# Patient Record
Sex: Male | Born: 1945 | Race: White | Hispanic: No | Marital: Married | Smoking: Never smoker
Health system: Southern US, Community
[De-identification: ages and names within clinical notes are randomized; demographics above are authoritative.]

## PROBLEM LIST (undated history)

## (undated) DIAGNOSIS — T783XXA Angioneurotic edema, initial encounter: Secondary | ICD-10-CM

## (undated) DIAGNOSIS — C61 Malignant neoplasm of prostate: Secondary | ICD-10-CM

## (undated) DIAGNOSIS — G4733 Obstructive sleep apnea (adult) (pediatric): Secondary | ICD-10-CM

## (undated) DIAGNOSIS — Z91038 Other insect allergy status: Secondary | ICD-10-CM

## (undated) HISTORY — PX: TONSILLECTOMY: SUR1361

## (undated) HISTORY — DX: Other insect allergy status: Z91.038

## (undated) HISTORY — DX: Obstructive sleep apnea (adult) (pediatric): G47.33

## (undated) HISTORY — DX: Malignant neoplasm of prostate: C61

## (undated) HISTORY — PX: ADENOIDECTOMY: SUR15

## (undated) HISTORY — DX: Angioneurotic edema, initial encounter: T78.3XXA

---

## 2013-05-20 DIAGNOSIS — M23305 Other meniscus derangements, unspecified medial meniscus, unspecified knee: Secondary | ICD-10-CM | POA: Diagnosis not present

## 2013-05-20 DIAGNOSIS — IMO0002 Reserved for concepts with insufficient information to code with codable children: Secondary | ICD-10-CM | POA: Diagnosis not present

## 2013-05-20 DIAGNOSIS — M1710 Unilateral primary osteoarthritis, unspecified knee: Secondary | ICD-10-CM | POA: Diagnosis not present

## 2013-06-02 DIAGNOSIS — Y999 Unspecified external cause status: Secondary | ICD-10-CM | POA: Diagnosis not present

## 2013-06-02 DIAGNOSIS — G8918 Other acute postprocedural pain: Secondary | ICD-10-CM | POA: Diagnosis not present

## 2013-06-02 DIAGNOSIS — X58XXXA Exposure to other specified factors, initial encounter: Secondary | ICD-10-CM | POA: Diagnosis not present

## 2013-06-02 DIAGNOSIS — M6750 Plica syndrome, unspecified knee: Secondary | ICD-10-CM | POA: Diagnosis not present

## 2013-06-02 DIAGNOSIS — Y939 Activity, unspecified: Secondary | ICD-10-CM | POA: Diagnosis not present

## 2013-06-02 DIAGNOSIS — Y929 Unspecified place or not applicable: Secondary | ICD-10-CM | POA: Diagnosis not present

## 2013-06-02 DIAGNOSIS — IMO0002 Reserved for concepts with insufficient information to code with codable children: Secondary | ICD-10-CM | POA: Diagnosis not present

## 2013-06-02 DIAGNOSIS — S83289A Other tear of lateral meniscus, current injury, unspecified knee, initial encounter: Secondary | ICD-10-CM | POA: Diagnosis not present

## 2013-06-10 DIAGNOSIS — T782XXA Anaphylactic shock, unspecified, initial encounter: Secondary | ICD-10-CM | POA: Diagnosis not present

## 2013-06-18 DIAGNOSIS — M25569 Pain in unspecified knee: Secondary | ICD-10-CM | POA: Diagnosis not present

## 2013-06-21 DIAGNOSIS — M25569 Pain in unspecified knee: Secondary | ICD-10-CM | POA: Diagnosis not present

## 2013-06-25 DIAGNOSIS — M25569 Pain in unspecified knee: Secondary | ICD-10-CM | POA: Diagnosis not present

## 2013-06-28 DIAGNOSIS — M25569 Pain in unspecified knee: Secondary | ICD-10-CM | POA: Diagnosis not present

## 2013-07-02 DIAGNOSIS — M25569 Pain in unspecified knee: Secondary | ICD-10-CM | POA: Diagnosis not present

## 2013-07-05 DIAGNOSIS — M25569 Pain in unspecified knee: Secondary | ICD-10-CM | POA: Diagnosis not present

## 2013-07-07 DIAGNOSIS — M25569 Pain in unspecified knee: Secondary | ICD-10-CM | POA: Diagnosis not present

## 2013-08-05 DIAGNOSIS — T6391XA Toxic effect of contact with unspecified venomous animal, accidental (unintentional), initial encounter: Secondary | ICD-10-CM | POA: Diagnosis not present

## 2013-08-27 DIAGNOSIS — D128 Benign neoplasm of rectum: Secondary | ICD-10-CM | POA: Diagnosis not present

## 2013-08-27 DIAGNOSIS — D126 Benign neoplasm of colon, unspecified: Secondary | ICD-10-CM | POA: Diagnosis not present

## 2013-08-27 DIAGNOSIS — D129 Benign neoplasm of anus and anal canal: Secondary | ICD-10-CM | POA: Diagnosis not present

## 2013-08-27 DIAGNOSIS — D175 Benign lipomatous neoplasm of intra-abdominal organs: Secondary | ICD-10-CM | POA: Diagnosis not present

## 2013-08-27 DIAGNOSIS — K5730 Diverticulosis of large intestine without perforation or abscess without bleeding: Secondary | ICD-10-CM | POA: Diagnosis not present

## 2013-08-27 DIAGNOSIS — Z86010 Personal history of colonic polyps: Secondary | ICD-10-CM | POA: Diagnosis not present

## 2013-09-21 DIAGNOSIS — K219 Gastro-esophageal reflux disease without esophagitis: Secondary | ICD-10-CM | POA: Diagnosis not present

## 2013-09-21 DIAGNOSIS — J309 Allergic rhinitis, unspecified: Secondary | ICD-10-CM | POA: Diagnosis not present

## 2013-09-30 DIAGNOSIS — T6391XA Toxic effect of contact with unspecified venomous animal, accidental (unintentional), initial encounter: Secondary | ICD-10-CM | POA: Diagnosis not present

## 2013-11-18 DIAGNOSIS — T6391XA Toxic effect of contact with unspecified venomous animal, accidental (unintentional), initial encounter: Secondary | ICD-10-CM | POA: Diagnosis not present

## 2013-12-28 DIAGNOSIS — I8390 Asymptomatic varicose veins of unspecified lower extremity: Secondary | ICD-10-CM | POA: Diagnosis not present

## 2014-01-07 DIAGNOSIS — I83893 Varicose veins of bilateral lower extremities with other complications: Secondary | ICD-10-CM | POA: Diagnosis not present

## 2014-01-12 DIAGNOSIS — I83893 Varicose veins of bilateral lower extremities with other complications: Secondary | ICD-10-CM | POA: Diagnosis not present

## 2014-01-13 DIAGNOSIS — T6391XA Toxic effect of contact with unspecified venomous animal, accidental (unintentional), initial encounter: Secondary | ICD-10-CM | POA: Diagnosis not present

## 2014-02-08 DIAGNOSIS — N390 Urinary tract infection, site not specified: Secondary | ICD-10-CM | POA: Diagnosis not present

## 2014-02-08 DIAGNOSIS — R972 Elevated prostate specific antigen [PSA]: Secondary | ICD-10-CM | POA: Diagnosis not present

## 2014-02-08 DIAGNOSIS — R5381 Other malaise: Secondary | ICD-10-CM | POA: Diagnosis not present

## 2014-02-08 DIAGNOSIS — R5383 Other fatigue: Secondary | ICD-10-CM | POA: Diagnosis not present

## 2014-02-08 DIAGNOSIS — N400 Enlarged prostate without lower urinary tract symptoms: Secondary | ICD-10-CM | POA: Diagnosis not present

## 2014-02-10 DIAGNOSIS — I10 Essential (primary) hypertension: Secondary | ICD-10-CM | POA: Diagnosis not present

## 2014-02-10 DIAGNOSIS — E559 Vitamin D deficiency, unspecified: Secondary | ICD-10-CM | POA: Diagnosis not present

## 2014-02-10 DIAGNOSIS — Z1329 Encounter for screening for other suspected endocrine disorder: Secondary | ICD-10-CM | POA: Diagnosis not present

## 2014-02-10 DIAGNOSIS — E782 Mixed hyperlipidemia: Secondary | ICD-10-CM | POA: Diagnosis not present

## 2014-02-10 DIAGNOSIS — Z130 Encounter for screening for diseases of the blood and blood-forming organs and certain disorders involving the immune mechanism: Secondary | ICD-10-CM | POA: Diagnosis not present

## 2014-02-10 DIAGNOSIS — K219 Gastro-esophageal reflux disease without esophagitis: Secondary | ICD-10-CM | POA: Diagnosis not present

## 2014-02-10 DIAGNOSIS — G4733 Obstructive sleep apnea (adult) (pediatric): Secondary | ICD-10-CM | POA: Diagnosis not present

## 2014-03-10 DIAGNOSIS — T63441D Toxic effect of venom of bees, accidental (unintentional), subsequent encounter: Secondary | ICD-10-CM | POA: Diagnosis not present

## 2014-03-10 DIAGNOSIS — T63441A Toxic effect of venom of bees, accidental (unintentional), initial encounter: Secondary | ICD-10-CM | POA: Diagnosis not present

## 2014-03-14 DIAGNOSIS — T63441D Toxic effect of venom of bees, accidental (unintentional), subsequent encounter: Secondary | ICD-10-CM | POA: Diagnosis not present

## 2014-03-14 DIAGNOSIS — T63441A Toxic effect of venom of bees, accidental (unintentional), initial encounter: Secondary | ICD-10-CM | POA: Diagnosis not present

## 2014-04-04 DIAGNOSIS — H25813 Combined forms of age-related cataract, bilateral: Secondary | ICD-10-CM | POA: Diagnosis not present

## 2014-04-04 DIAGNOSIS — H18413 Arcus senilis, bilateral: Secondary | ICD-10-CM | POA: Diagnosis not present

## 2014-04-04 DIAGNOSIS — H35372 Puckering of macula, left eye: Secondary | ICD-10-CM | POA: Diagnosis not present

## 2014-04-06 DIAGNOSIS — R5383 Other fatigue: Secondary | ICD-10-CM | POA: Diagnosis not present

## 2014-04-13 DIAGNOSIS — I872 Venous insufficiency (chronic) (peripheral): Secondary | ICD-10-CM | POA: Diagnosis not present

## 2014-04-13 DIAGNOSIS — I83812 Varicose veins of left lower extremities with pain: Secondary | ICD-10-CM | POA: Diagnosis not present

## 2014-04-27 DIAGNOSIS — E298 Other testicular dysfunction: Secondary | ICD-10-CM | POA: Diagnosis not present

## 2014-05-05 DIAGNOSIS — T63441A Toxic effect of venom of bees, accidental (unintentional), initial encounter: Secondary | ICD-10-CM | POA: Diagnosis not present

## 2014-05-17 DIAGNOSIS — T63441A Toxic effect of venom of bees, accidental (unintentional), initial encounter: Secondary | ICD-10-CM | POA: Diagnosis not present

## 2014-05-18 DIAGNOSIS — E298 Other testicular dysfunction: Secondary | ICD-10-CM | POA: Diagnosis not present

## 2014-05-18 DIAGNOSIS — T63441A Toxic effect of venom of bees, accidental (unintentional), initial encounter: Secondary | ICD-10-CM | POA: Diagnosis not present

## 2014-05-19 DIAGNOSIS — T63441A Toxic effect of venom of bees, accidental (unintentional), initial encounter: Secondary | ICD-10-CM | POA: Diagnosis not present

## 2014-06-08 DIAGNOSIS — E291 Testicular hypofunction: Secondary | ICD-10-CM | POA: Diagnosis not present

## 2014-06-14 DIAGNOSIS — E298 Other testicular dysfunction: Secondary | ICD-10-CM | POA: Diagnosis not present

## 2014-06-28 DIAGNOSIS — G4733 Obstructive sleep apnea (adult) (pediatric): Secondary | ICD-10-CM | POA: Diagnosis not present

## 2014-06-29 DIAGNOSIS — E291 Testicular hypofunction: Secondary | ICD-10-CM | POA: Diagnosis not present

## 2014-06-30 DIAGNOSIS — T63441A Toxic effect of venom of bees, accidental (unintentional), initial encounter: Secondary | ICD-10-CM | POA: Diagnosis not present

## 2014-07-07 DIAGNOSIS — I872 Venous insufficiency (chronic) (peripheral): Secondary | ICD-10-CM | POA: Diagnosis not present

## 2014-07-07 DIAGNOSIS — I83812 Varicose veins of left lower extremities with pain: Secondary | ICD-10-CM | POA: Diagnosis not present

## 2014-07-20 DIAGNOSIS — E298 Other testicular dysfunction: Secondary | ICD-10-CM | POA: Diagnosis not present

## 2014-07-21 DIAGNOSIS — I83812 Varicose veins of left lower extremities with pain: Secondary | ICD-10-CM | POA: Diagnosis not present

## 2014-07-21 DIAGNOSIS — I872 Venous insufficiency (chronic) (peripheral): Secondary | ICD-10-CM | POA: Diagnosis not present

## 2014-08-10 DIAGNOSIS — E298 Other testicular dysfunction: Secondary | ICD-10-CM | POA: Diagnosis not present

## 2014-08-15 DIAGNOSIS — M25561 Pain in right knee: Secondary | ICD-10-CM | POA: Diagnosis not present

## 2014-08-25 DIAGNOSIS — T63441A Toxic effect of venom of bees, accidental (unintentional), initial encounter: Secondary | ICD-10-CM | POA: Diagnosis not present

## 2014-09-21 DIAGNOSIS — E298 Other testicular dysfunction: Secondary | ICD-10-CM | POA: Diagnosis not present

## 2014-10-20 DIAGNOSIS — T63441A Toxic effect of venom of bees, accidental (unintentional), initial encounter: Secondary | ICD-10-CM | POA: Diagnosis not present

## 2014-12-09 DIAGNOSIS — E298 Other testicular dysfunction: Secondary | ICD-10-CM | POA: Diagnosis not present

## 2014-12-15 DIAGNOSIS — T63441A Toxic effect of venom of bees, accidental (unintentional), initial encounter: Secondary | ICD-10-CM | POA: Diagnosis not present

## 2015-01-11 DIAGNOSIS — T782XXA Anaphylactic shock, unspecified, initial encounter: Secondary | ICD-10-CM

## 2015-01-11 DIAGNOSIS — T63481A Toxic effect of venom of other arthropod, accidental (unintentional), initial encounter: Secondary | ICD-10-CM | POA: Insufficient documentation

## 2015-01-11 DIAGNOSIS — T7800XA Anaphylactic reaction due to unspecified food, initial encounter: Secondary | ICD-10-CM | POA: Insufficient documentation

## 2015-01-30 DIAGNOSIS — T63441A Toxic effect of venom of bees, accidental (unintentional), initial encounter: Secondary | ICD-10-CM | POA: Diagnosis not present

## 2015-01-31 DIAGNOSIS — T63441A Toxic effect of venom of bees, accidental (unintentional), initial encounter: Secondary | ICD-10-CM | POA: Diagnosis not present

## 2015-02-01 DIAGNOSIS — T63441A Toxic effect of venom of bees, accidental (unintentional), initial encounter: Secondary | ICD-10-CM | POA: Diagnosis not present

## 2015-02-06 DIAGNOSIS — T63441A Toxic effect of venom of bees, accidental (unintentional), initial encounter: Secondary | ICD-10-CM | POA: Diagnosis not present

## 2015-03-02 DIAGNOSIS — E291 Testicular hypofunction: Secondary | ICD-10-CM | POA: Diagnosis not present

## 2015-03-02 DIAGNOSIS — N401 Enlarged prostate with lower urinary tract symptoms: Secondary | ICD-10-CM | POA: Diagnosis not present

## 2015-03-02 DIAGNOSIS — R351 Nocturia: Secondary | ICD-10-CM | POA: Diagnosis not present

## 2015-03-02 DIAGNOSIS — N5203 Combined arterial insufficiency and corporo-venous occlusive erectile dysfunction: Secondary | ICD-10-CM | POA: Diagnosis not present

## 2015-03-02 DIAGNOSIS — N3020 Other chronic cystitis without hematuria: Secondary | ICD-10-CM | POA: Diagnosis not present

## 2015-03-02 DIAGNOSIS — E298 Other testicular dysfunction: Secondary | ICD-10-CM | POA: Diagnosis not present

## 2015-03-03 DIAGNOSIS — Z23 Encounter for immunization: Secondary | ICD-10-CM | POA: Diagnosis not present

## 2015-03-03 DIAGNOSIS — G4733 Obstructive sleep apnea (adult) (pediatric): Secondary | ICD-10-CM | POA: Diagnosis not present

## 2015-03-03 DIAGNOSIS — Z0000 Encounter for general adult medical examination without abnormal findings: Secondary | ICD-10-CM | POA: Diagnosis not present

## 2015-03-03 DIAGNOSIS — E559 Vitamin D deficiency, unspecified: Secondary | ICD-10-CM | POA: Diagnosis not present

## 2015-03-03 DIAGNOSIS — Z79899 Other long term (current) drug therapy: Secondary | ICD-10-CM | POA: Diagnosis not present

## 2015-03-03 DIAGNOSIS — I10 Essential (primary) hypertension: Secondary | ICD-10-CM | POA: Diagnosis not present

## 2015-03-03 DIAGNOSIS — E782 Mixed hyperlipidemia: Secondary | ICD-10-CM | POA: Diagnosis not present

## 2015-03-03 DIAGNOSIS — K219 Gastro-esophageal reflux disease without esophagitis: Secondary | ICD-10-CM | POA: Diagnosis not present

## 2015-03-27 ENCOUNTER — Ambulatory Visit (INDEPENDENT_AMBULATORY_CARE_PROVIDER_SITE_OTHER): Payer: Medicare Other

## 2015-03-27 DIAGNOSIS — T63441D Toxic effect of venom of bees, accidental (unintentional), subsequent encounter: Secondary | ICD-10-CM

## 2015-04-10 DIAGNOSIS — H25813 Combined forms of age-related cataract, bilateral: Secondary | ICD-10-CM | POA: Diagnosis not present

## 2015-05-22 ENCOUNTER — Ambulatory Visit (INDEPENDENT_AMBULATORY_CARE_PROVIDER_SITE_OTHER): Payer: Medicare Other

## 2015-05-22 DIAGNOSIS — T63441D Toxic effect of venom of bees, accidental (unintentional), subsequent encounter: Secondary | ICD-10-CM | POA: Diagnosis not present

## 2015-05-23 DIAGNOSIS — E782 Mixed hyperlipidemia: Secondary | ICD-10-CM | POA: Diagnosis not present

## 2015-07-17 ENCOUNTER — Ambulatory Visit (INDEPENDENT_AMBULATORY_CARE_PROVIDER_SITE_OTHER): Payer: Medicare Other

## 2015-07-17 DIAGNOSIS — T63441D Toxic effect of venom of bees, accidental (unintentional), subsequent encounter: Secondary | ICD-10-CM

## 2015-08-23 DIAGNOSIS — G4733 Obstructive sleep apnea (adult) (pediatric): Secondary | ICD-10-CM | POA: Diagnosis not present

## 2015-09-11 ENCOUNTER — Ambulatory Visit (INDEPENDENT_AMBULATORY_CARE_PROVIDER_SITE_OTHER): Payer: Medicare Other

## 2015-09-11 DIAGNOSIS — T63441D Toxic effect of venom of bees, accidental (unintentional), subsequent encounter: Secondary | ICD-10-CM | POA: Diagnosis not present

## 2015-11-16 ENCOUNTER — Ambulatory Visit (INDEPENDENT_AMBULATORY_CARE_PROVIDER_SITE_OTHER): Payer: Medicare Other | Admitting: *Deleted

## 2015-11-16 DIAGNOSIS — T63441D Toxic effect of venom of bees, accidental (unintentional), subsequent encounter: Secondary | ICD-10-CM | POA: Diagnosis not present

## 2016-01-11 ENCOUNTER — Ambulatory Visit (INDEPENDENT_AMBULATORY_CARE_PROVIDER_SITE_OTHER): Payer: Medicare Other | Admitting: *Deleted

## 2016-01-11 DIAGNOSIS — T63441D Toxic effect of venom of bees, accidental (unintentional), subsequent encounter: Secondary | ICD-10-CM | POA: Diagnosis not present

## 2016-01-24 DIAGNOSIS — E782 Mixed hyperlipidemia: Secondary | ICD-10-CM | POA: Diagnosis not present

## 2016-01-24 DIAGNOSIS — Z79899 Other long term (current) drug therapy: Secondary | ICD-10-CM | POA: Diagnosis not present

## 2016-01-24 DIAGNOSIS — E559 Vitamin D deficiency, unspecified: Secondary | ICD-10-CM | POA: Diagnosis not present

## 2016-01-29 DIAGNOSIS — L820 Inflamed seborrheic keratosis: Secondary | ICD-10-CM | POA: Diagnosis not present

## 2016-01-29 DIAGNOSIS — D1801 Hemangioma of skin and subcutaneous tissue: Secondary | ICD-10-CM | POA: Diagnosis not present

## 2016-03-07 ENCOUNTER — Ambulatory Visit (INDEPENDENT_AMBULATORY_CARE_PROVIDER_SITE_OTHER): Payer: Medicare Other | Admitting: *Deleted

## 2016-03-07 DIAGNOSIS — T63441D Toxic effect of venom of bees, accidental (unintentional), subsequent encounter: Secondary | ICD-10-CM | POA: Diagnosis not present

## 2016-04-16 DIAGNOSIS — N3020 Other chronic cystitis without hematuria: Secondary | ICD-10-CM | POA: Diagnosis not present

## 2016-04-16 DIAGNOSIS — N318 Other neuromuscular dysfunction of bladder: Secondary | ICD-10-CM | POA: Diagnosis not present

## 2016-04-16 DIAGNOSIS — R972 Elevated prostate specific antigen [PSA]: Secondary | ICD-10-CM | POA: Diagnosis not present

## 2016-04-16 DIAGNOSIS — N400 Enlarged prostate without lower urinary tract symptoms: Secondary | ICD-10-CM | POA: Diagnosis not present

## 2016-04-24 DIAGNOSIS — Z0000 Encounter for general adult medical examination without abnormal findings: Secondary | ICD-10-CM | POA: Diagnosis not present

## 2016-04-24 DIAGNOSIS — Z23 Encounter for immunization: Secondary | ICD-10-CM | POA: Diagnosis not present

## 2016-04-24 DIAGNOSIS — R7309 Other abnormal glucose: Secondary | ICD-10-CM | POA: Diagnosis not present

## 2016-04-24 DIAGNOSIS — N401 Enlarged prostate with lower urinary tract symptoms: Secondary | ICD-10-CM | POA: Diagnosis not present

## 2016-04-24 DIAGNOSIS — G4733 Obstructive sleep apnea (adult) (pediatric): Secondary | ICD-10-CM | POA: Diagnosis not present

## 2016-04-24 DIAGNOSIS — E559 Vitamin D deficiency, unspecified: Secondary | ICD-10-CM | POA: Diagnosis not present

## 2016-04-24 DIAGNOSIS — Z9989 Dependence on other enabling machines and devices: Secondary | ICD-10-CM | POA: Diagnosis not present

## 2016-04-24 DIAGNOSIS — I10 Essential (primary) hypertension: Secondary | ICD-10-CM | POA: Diagnosis not present

## 2016-04-24 DIAGNOSIS — E782 Mixed hyperlipidemia: Secondary | ICD-10-CM | POA: Diagnosis not present

## 2016-05-02 ENCOUNTER — Ambulatory Visit (INDEPENDENT_AMBULATORY_CARE_PROVIDER_SITE_OTHER): Payer: Medicare Other | Admitting: *Deleted

## 2016-05-02 DIAGNOSIS — T63441D Toxic effect of venom of bees, accidental (unintentional), subsequent encounter: Secondary | ICD-10-CM

## 2016-05-07 DIAGNOSIS — H25813 Combined forms of age-related cataract, bilateral: Secondary | ICD-10-CM | POA: Diagnosis not present

## 2016-05-07 DIAGNOSIS — H18413 Arcus senilis, bilateral: Secondary | ICD-10-CM | POA: Diagnosis not present

## 2016-06-06 DIAGNOSIS — N3020 Other chronic cystitis without hematuria: Secondary | ICD-10-CM | POA: Diagnosis not present

## 2016-06-06 DIAGNOSIS — R972 Elevated prostate specific antigen [PSA]: Secondary | ICD-10-CM | POA: Diagnosis not present

## 2016-06-27 ENCOUNTER — Ambulatory Visit (INDEPENDENT_AMBULATORY_CARE_PROVIDER_SITE_OTHER): Payer: Medicare Other | Admitting: *Deleted

## 2016-06-27 DIAGNOSIS — T63441D Toxic effect of venom of bees, accidental (unintentional), subsequent encounter: Secondary | ICD-10-CM | POA: Diagnosis not present

## 2016-08-22 ENCOUNTER — Ambulatory Visit (INDEPENDENT_AMBULATORY_CARE_PROVIDER_SITE_OTHER): Payer: Medicare Other | Admitting: *Deleted

## 2016-08-22 DIAGNOSIS — T63441D Toxic effect of venom of bees, accidental (unintentional), subsequent encounter: Secondary | ICD-10-CM

## 2016-10-24 ENCOUNTER — Ambulatory Visit (INDEPENDENT_AMBULATORY_CARE_PROVIDER_SITE_OTHER): Payer: Medicare Other

## 2016-10-24 DIAGNOSIS — T63441D Toxic effect of venom of bees, accidental (unintentional), subsequent encounter: Secondary | ICD-10-CM | POA: Diagnosis not present

## 2016-10-29 DIAGNOSIS — G4733 Obstructive sleep apnea (adult) (pediatric): Secondary | ICD-10-CM | POA: Diagnosis not present

## 2016-12-19 ENCOUNTER — Ambulatory Visit (INDEPENDENT_AMBULATORY_CARE_PROVIDER_SITE_OTHER): Payer: Medicare Other | Admitting: *Deleted

## 2016-12-19 DIAGNOSIS — T63441D Toxic effect of venom of bees, accidental (unintentional), subsequent encounter: Secondary | ICD-10-CM | POA: Diagnosis not present

## 2016-12-22 DIAGNOSIS — G4733 Obstructive sleep apnea (adult) (pediatric): Secondary | ICD-10-CM | POA: Diagnosis not present

## 2017-01-02 DIAGNOSIS — L821 Other seborrheic keratosis: Secondary | ICD-10-CM | POA: Diagnosis not present

## 2017-01-02 DIAGNOSIS — D485 Neoplasm of uncertain behavior of skin: Secondary | ICD-10-CM | POA: Diagnosis not present

## 2017-01-02 DIAGNOSIS — L570 Actinic keratosis: Secondary | ICD-10-CM | POA: Diagnosis not present

## 2017-01-08 DIAGNOSIS — G4733 Obstructive sleep apnea (adult) (pediatric): Secondary | ICD-10-CM | POA: Diagnosis not present

## 2017-02-13 ENCOUNTER — Ambulatory Visit (INDEPENDENT_AMBULATORY_CARE_PROVIDER_SITE_OTHER): Payer: Medicare Other | Admitting: *Deleted

## 2017-02-13 DIAGNOSIS — T63441D Toxic effect of venom of bees, accidental (unintentional), subsequent encounter: Secondary | ICD-10-CM | POA: Diagnosis not present

## 2017-04-10 ENCOUNTER — Ambulatory Visit (INDEPENDENT_AMBULATORY_CARE_PROVIDER_SITE_OTHER): Payer: Medicare Other | Admitting: *Deleted

## 2017-04-10 DIAGNOSIS — T63441D Toxic effect of venom of bees, accidental (unintentional), subsequent encounter: Secondary | ICD-10-CM | POA: Diagnosis not present

## 2017-04-18 DIAGNOSIS — N3020 Other chronic cystitis without hematuria: Secondary | ICD-10-CM | POA: Diagnosis not present

## 2017-04-18 DIAGNOSIS — N318 Other neuromuscular dysfunction of bladder: Secondary | ICD-10-CM | POA: Diagnosis not present

## 2017-04-18 DIAGNOSIS — R972 Elevated prostate specific antigen [PSA]: Secondary | ICD-10-CM | POA: Diagnosis not present

## 2017-04-18 DIAGNOSIS — N400 Enlarged prostate without lower urinary tract symptoms: Secondary | ICD-10-CM | POA: Diagnosis not present

## 2017-04-25 DIAGNOSIS — R972 Elevated prostate specific antigen [PSA]: Secondary | ICD-10-CM | POA: Diagnosis not present

## 2017-04-29 DIAGNOSIS — Z23 Encounter for immunization: Secondary | ICD-10-CM | POA: Diagnosis not present

## 2017-04-29 DIAGNOSIS — Z0000 Encounter for general adult medical examination without abnormal findings: Secondary | ICD-10-CM | POA: Diagnosis not present

## 2017-04-29 DIAGNOSIS — E559 Vitamin D deficiency, unspecified: Secondary | ICD-10-CM | POA: Diagnosis not present

## 2017-04-29 DIAGNOSIS — K219 Gastro-esophageal reflux disease without esophagitis: Secondary | ICD-10-CM | POA: Diagnosis not present

## 2017-04-29 DIAGNOSIS — Z125 Encounter for screening for malignant neoplasm of prostate: Secondary | ICD-10-CM | POA: Diagnosis not present

## 2017-04-29 DIAGNOSIS — R5383 Other fatigue: Secondary | ICD-10-CM | POA: Diagnosis not present

## 2017-04-29 DIAGNOSIS — N401 Enlarged prostate with lower urinary tract symptoms: Secondary | ICD-10-CM | POA: Diagnosis not present

## 2017-04-29 DIAGNOSIS — Z87898 Personal history of other specified conditions: Secondary | ICD-10-CM | POA: Diagnosis not present

## 2017-04-29 DIAGNOSIS — E782 Mixed hyperlipidemia: Secondary | ICD-10-CM | POA: Diagnosis not present

## 2017-04-29 DIAGNOSIS — Z1159 Encounter for screening for other viral diseases: Secondary | ICD-10-CM | POA: Diagnosis not present

## 2017-04-29 DIAGNOSIS — N138 Other obstructive and reflux uropathy: Secondary | ICD-10-CM | POA: Diagnosis not present

## 2017-04-29 DIAGNOSIS — I10 Essential (primary) hypertension: Secondary | ICD-10-CM | POA: Diagnosis not present

## 2017-06-05 ENCOUNTER — Ambulatory Visit (INDEPENDENT_AMBULATORY_CARE_PROVIDER_SITE_OTHER): Payer: Medicare Other | Admitting: *Deleted

## 2017-06-05 DIAGNOSIS — T63441D Toxic effect of venom of bees, accidental (unintentional), subsequent encounter: Secondary | ICD-10-CM

## 2017-07-31 ENCOUNTER — Ambulatory Visit (INDEPENDENT_AMBULATORY_CARE_PROVIDER_SITE_OTHER): Payer: Medicare Other | Admitting: *Deleted

## 2017-07-31 DIAGNOSIS — T63441D Toxic effect of venom of bees, accidental (unintentional), subsequent encounter: Secondary | ICD-10-CM

## 2017-09-25 ENCOUNTER — Ambulatory Visit (INDEPENDENT_AMBULATORY_CARE_PROVIDER_SITE_OTHER): Payer: Medicare Other | Admitting: *Deleted

## 2017-09-25 DIAGNOSIS — T63441D Toxic effect of venom of bees, accidental (unintentional), subsequent encounter: Secondary | ICD-10-CM

## 2017-11-20 ENCOUNTER — Ambulatory Visit (INDEPENDENT_AMBULATORY_CARE_PROVIDER_SITE_OTHER): Payer: Medicare Other | Admitting: *Deleted

## 2017-11-20 DIAGNOSIS — T63441D Toxic effect of venom of bees, accidental (unintentional), subsequent encounter: Secondary | ICD-10-CM

## 2018-01-15 ENCOUNTER — Ambulatory Visit (INDEPENDENT_AMBULATORY_CARE_PROVIDER_SITE_OTHER): Payer: Medicare Other | Admitting: *Deleted

## 2018-01-15 DIAGNOSIS — T63441D Toxic effect of venom of bees, accidental (unintentional), subsequent encounter: Secondary | ICD-10-CM

## 2018-01-16 ENCOUNTER — Other Ambulatory Visit: Payer: Self-pay | Admitting: Allergy and Immunology

## 2018-01-16 NOTE — Telephone Encounter (Signed)
Called and left a message for patient to call office in regards to medication refill. Patient hasn't not been seen in epic and will need an OV. Patient is on venom injections so will send Rx with 0 refills, patient will need OV for further refills.

## 2018-03-12 ENCOUNTER — Ambulatory Visit (INDEPENDENT_AMBULATORY_CARE_PROVIDER_SITE_OTHER): Payer: Medicare Other | Admitting: *Deleted

## 2018-03-12 DIAGNOSIS — T63441D Toxic effect of venom of bees, accidental (unintentional), subsequent encounter: Secondary | ICD-10-CM | POA: Diagnosis not present

## 2018-04-30 ENCOUNTER — Encounter: Payer: Self-pay | Admitting: Allergy and Immunology

## 2018-04-30 ENCOUNTER — Ambulatory Visit (INDEPENDENT_AMBULATORY_CARE_PROVIDER_SITE_OTHER): Payer: Medicare Other | Admitting: Allergy and Immunology

## 2018-04-30 VITALS — BP 140/90 | HR 94 | Temp 98.7°F | Resp 18 | Ht 72.0 in | Wt 233.0 lb

## 2018-04-30 DIAGNOSIS — R252 Cramp and spasm: Secondary | ICD-10-CM

## 2018-04-30 DIAGNOSIS — T7800XD Anaphylactic reaction due to unspecified food, subsequent encounter: Secondary | ICD-10-CM | POA: Diagnosis not present

## 2018-04-30 DIAGNOSIS — T39015D Adverse effect of aspirin, subsequent encounter: Secondary | ICD-10-CM

## 2018-04-30 DIAGNOSIS — Z79899 Other long term (current) drug therapy: Secondary | ICD-10-CM

## 2018-04-30 DIAGNOSIS — T782XXD Anaphylactic shock, unspecified, subsequent encounter: Secondary | ICD-10-CM

## 2018-04-30 DIAGNOSIS — T63481D Toxic effect of venom of other arthropod, accidental (unintentional), subsequent encounter: Secondary | ICD-10-CM

## 2018-04-30 DIAGNOSIS — T783XXD Angioneurotic edema, subsequent encounter: Secondary | ICD-10-CM | POA: Diagnosis not present

## 2018-04-30 NOTE — Patient Instructions (Addendum)
  1.  Continue immunotherapy and EpiPen  2.  Discontinue lisinopril.  Check with Dr. Unk Lightning about replacement  3.  Try magnesium supplementation for leg cramps  4.  Return to clinic in 1 year or earlier if problem

## 2018-04-30 NOTE — Progress Notes (Signed)
Follow-up Note  Referring Provider: No ref. provider found Primary Provider: Myrlene Broker, MD Date of Office Visit: 04/30/2018  Subjective:   Ruben Rice. (DOB: 1946-01-22) is a 72 y.o. 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  He hass done quite well with his immunotherapy currently at every 8 weeks.  He has not had any episodes of anaphylaxis.  He continues to carry an EpiPen.  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  Over the course of the past 10 days he has had 2 episodes of lip swelling.  He has woken up in the morning with lip swelling without any other associated systemic or constitutional symptoms and without any obvious provoking factor.  He is using lisinopril.  Wadell also relates a longstanding history of developing calf cramps while he sleeps.  Allergies as of 04/30/2018      Reactions   Aspirin Anaphylaxis   Mixed Vespid Venom Anaphylaxis   Shellfish Allergy Anaphylaxis   Wasp Venom Anaphylaxis      Medication List      dexlansoprazole 60 MG capsule Commonly known as:  DEXILANT Take 60 mg by mouth daily.   EPINEPHrine 0.3 mg/0.3 mL Soaj injection Commonly known as:  EPI-PEN USE AS DIRECTED FOR LIFE THREATENING ALLERGIC REACTIONS   lisinopril 10 MG tablet Commonly known as:  PRINIVIL,ZESTRIL Take 10 mg by mouth daily.   rosuvastatin 5 MG tablet Commonly known as:  CRESTOR Take 5 mg by mouth daily.       Past Medical History:  Diagnosis Date  . Angio-edema     Past Surgical History:  Procedure Laterality Date  . ADENOIDECTOMY    . TONSILLECTOMY      Review of systems negative except as noted in HPI / PMHx or noted  below:  Review of Systems  Constitutional: Negative.   HENT: Negative.   Eyes: Negative.   Respiratory: Negative.   Cardiovascular: Negative.   Gastrointestinal: Negative.   Genitourinary: Negative.   Musculoskeletal: Negative.   Skin: Negative.   Neurological: Negative.   Endo/Heme/Allergies: Negative.   Psychiatric/Behavioral: Negative.      Objective:   Vitals:   04/30/18 1055  BP: 140/90  Pulse: 94  Resp: 18  Temp: 98.7 F (37.1 C)  SpO2: 94%   Height: 6' (182.9 cm)  Weight: 233 lb (105.7 kg)   Physical Exam Constitutional:      Appearance: He is not diaphoretic.  HENT:     Head: Normocephalic.     Right Ear: Tympanic membrane, ear canal and external ear normal.     Left Ear: Tympanic membrane, ear canal and external ear normal.     Nose: Nose normal. No mucosal edema or rhinorrhea.     Mouth/Throat:     Pharynx: Uvula midline. No oropharyngeal exudate.  Eyes:     Conjunctiva/sclera: Conjunctivae normal.  Neck:     Thyroid: No thyromegaly.     Trachea: Trachea normal. No tracheal tenderness or tracheal deviation.  Cardiovascular:     Rate and Rhythm: Normal rate and regular rhythm.     Heart sounds: Normal heart sounds, S1 normal and S2 normal. No murmur.  Pulmonary:     Effort: No respiratory distress.  Breath sounds: Normal breath sounds. No stridor. No wheezing or rales.  Lymphadenopathy:     Head:     Right side of head: No tonsillar adenopathy.     Left side of head: No tonsillar adenopathy.     Cervical: No cervical adenopathy.  Skin:    Findings: No erythema or rash.     Nails: There is no clubbing.   Neurological:     Mental Status: He is alert.     Diagnostics: None  Assessment and Plan:   1. Anaphylaxis due to hymenoptera venom, accidental or unintentional, subsequent encounter   2. Angioedema, subsequent encounter   3. On angiotensin-converting enzyme (ACE) inhibitors   4. Anaphylactic shock due to food, subsequent encounter    5. Aspirin adverse reaction, subsequent encounter   6. Leg cramps     1.  Continue immunotherapy and EpiPen  2.  Discontinue lisinopril.  Check with Dr. Unk Lightning about replacement  3.  Try magnesium supplementation for leg cramps  4.  Return to clinic in 1 year or earlier if problem  Ruben Rice is doing quite well with his immunotherapy and he will continue on this form of treatment and also continue to perform avoidance measures regarding hymenoptera, shellfish, and aspirin.  He has had 2 episodes of lip swelling and he needs to discontinue his lisinopril assuming that this is the provoking factor for those episodes of angioedema.  He will discontinue this agent today and check with Dr. Unk Lightning today about a replacement.  I will see him back in this clinic in 1 year or earlier if there is a problem.  Ruben Katz, MD Allergy / Immunology [place]

## 2018-05-03 ENCOUNTER — Encounter: Payer: Self-pay | Admitting: Allergy and Immunology

## 2018-06-22 ENCOUNTER — Ambulatory Visit (INDEPENDENT_AMBULATORY_CARE_PROVIDER_SITE_OTHER): Payer: Medicare Other | Admitting: *Deleted

## 2018-06-22 DIAGNOSIS — T782XXD Anaphylactic shock, unspecified, subsequent encounter: Secondary | ICD-10-CM

## 2018-06-22 DIAGNOSIS — T63481D Toxic effect of venom of other arthropod, accidental (unintentional), subsequent encounter: Secondary | ICD-10-CM | POA: Diagnosis not present

## 2018-06-25 ENCOUNTER — Ambulatory Visit: Payer: Self-pay

## 2018-08-17 ENCOUNTER — Other Ambulatory Visit: Payer: Self-pay

## 2018-08-17 ENCOUNTER — Ambulatory Visit (INDEPENDENT_AMBULATORY_CARE_PROVIDER_SITE_OTHER): Payer: Medicare Other | Admitting: *Deleted

## 2018-08-17 DIAGNOSIS — T63481D Toxic effect of venom of other arthropod, accidental (unintentional), subsequent encounter: Secondary | ICD-10-CM

## 2018-08-17 DIAGNOSIS — T782XXD Anaphylactic shock, unspecified, subsequent encounter: Secondary | ICD-10-CM | POA: Diagnosis not present

## 2018-10-12 ENCOUNTER — Ambulatory Visit (INDEPENDENT_AMBULATORY_CARE_PROVIDER_SITE_OTHER): Payer: Medicare Other | Admitting: *Deleted

## 2018-10-12 ENCOUNTER — Other Ambulatory Visit: Payer: Self-pay

## 2018-10-12 DIAGNOSIS — T63481D Toxic effect of venom of other arthropod, accidental (unintentional), subsequent encounter: Secondary | ICD-10-CM | POA: Diagnosis not present

## 2018-10-12 DIAGNOSIS — T782XXD Anaphylactic shock, unspecified, subsequent encounter: Secondary | ICD-10-CM | POA: Diagnosis not present

## 2018-12-07 ENCOUNTER — Other Ambulatory Visit: Payer: Self-pay

## 2018-12-07 ENCOUNTER — Ambulatory Visit (INDEPENDENT_AMBULATORY_CARE_PROVIDER_SITE_OTHER): Payer: Medicare Other | Admitting: *Deleted

## 2018-12-07 DIAGNOSIS — T63481D Toxic effect of venom of other arthropod, accidental (unintentional), subsequent encounter: Secondary | ICD-10-CM | POA: Diagnosis not present

## 2018-12-07 DIAGNOSIS — T782XXD Anaphylactic shock, unspecified, subsequent encounter: Secondary | ICD-10-CM

## 2019-02-01 ENCOUNTER — Ambulatory Visit (INDEPENDENT_AMBULATORY_CARE_PROVIDER_SITE_OTHER): Payer: Medicare Other | Admitting: *Deleted

## 2019-02-01 ENCOUNTER — Other Ambulatory Visit: Payer: Self-pay

## 2019-02-01 DIAGNOSIS — T782XXD Anaphylactic shock, unspecified, subsequent encounter: Secondary | ICD-10-CM | POA: Diagnosis not present

## 2019-02-01 DIAGNOSIS — T63481D Toxic effect of venom of other arthropod, accidental (unintentional), subsequent encounter: Secondary | ICD-10-CM | POA: Diagnosis not present

## 2019-03-29 ENCOUNTER — Other Ambulatory Visit: Payer: Self-pay

## 2019-03-29 ENCOUNTER — Ambulatory Visit (INDEPENDENT_AMBULATORY_CARE_PROVIDER_SITE_OTHER): Payer: Medicare Other

## 2019-03-29 DIAGNOSIS — T782XXD Anaphylactic shock, unspecified, subsequent encounter: Secondary | ICD-10-CM

## 2019-03-29 DIAGNOSIS — T63481D Toxic effect of venom of other arthropod, accidental (unintentional), subsequent encounter: Secondary | ICD-10-CM | POA: Diagnosis not present

## 2019-05-17 ENCOUNTER — Other Ambulatory Visit: Payer: Self-pay | Admitting: Allergy and Immunology

## 2019-05-19 ENCOUNTER — Other Ambulatory Visit: Payer: Self-pay

## 2019-05-19 ENCOUNTER — Ambulatory Visit (INDEPENDENT_AMBULATORY_CARE_PROVIDER_SITE_OTHER): Payer: Medicare Other | Admitting: Allergy and Immunology

## 2019-05-19 DIAGNOSIS — T63481D Toxic effect of venom of other arthropod, accidental (unintentional), subsequent encounter: Secondary | ICD-10-CM

## 2019-05-19 DIAGNOSIS — T782XXD Anaphylactic shock, unspecified, subsequent encounter: Secondary | ICD-10-CM | POA: Diagnosis not present

## 2019-05-19 DIAGNOSIS — T7800XD Anaphylactic reaction due to unspecified food, subsequent encounter: Secondary | ICD-10-CM

## 2019-05-19 DIAGNOSIS — T39015D Adverse effect of aspirin, subsequent encounter: Secondary | ICD-10-CM | POA: Diagnosis not present

## 2019-05-19 MED ORDER — EPINEPHRINE 0.3 MG/0.3ML IJ SOAJ: INTRAMUSCULAR | 3 refills | Status: DC

## 2019-05-19 NOTE — Patient Instructions (Addendum)
  1.  Continue immunotherapy and EpiPen  2.  Return to clinic in 1 year or earlier if problem  3. Obtain COVID vaccine

## 2019-05-19 NOTE — Progress Notes (Signed)
[facility] [place] [facility] [place]   Follow-up Note  Referring Provider: Myrlene Broker, MD Primary Provider: Myrlene Broker, MD Date of Office Visit: 05/19/2019  Subjective:   Ruben Rice. (DOB: 04/26/1946) is a 74 y.o. 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  Wisdom continues to use immunotherapy directed against hymenoptera venom without any adverse effect.  He does carry an EpiPen.  He remains away from consumption of shellfish and aspirin.  His lip swelling completely resolved after eliminating his lisinopril during his last visit.  As well, all of his calf cramps resolved after discontinuing lisinopril.  He does inform me that he has had a intermittently high PSA and he has had four prostate biopsies in investigation of this issue and has also been treated for prostatitis with 1 month of amoxicillin.  He did receive the flu vaccine this year.  Allergies as of 05/19/2019      Reactions   Aspirin Anaphylaxis   Mixed Vespid Venom Anaphylaxis   Shellfish Allergy Anaphylaxis   Wasp Venom Anaphylaxis      Medication List    amLODipine 5 MG tablet Commonly known as: NORVASC   EPINEPHrine 0.3 mg/0.3 mL Soaj injection Commonly known as: EPI-PEN Use for life threatening allergic reactions   pantoprazole 40 MG tablet Commonly known as: PROTONIX   rosuvastatin 5 MG tablet Commonly known as: CRESTOR Take 5 mg by mouth daily.       Past Medical History:  Diagnosis Date  . Angio-edema     Past Surgical History:  Procedure Laterality Date  . ADENOIDECTOMY    . TONSILLECTOMY       Review of systems negative except as noted in HPI / PMHx or noted below:  Review of Systems  Constitutional: Negative.   HENT: Negative.   Eyes: Negative.   Respiratory: Negative.   Cardiovascular: Negative.   Gastrointestinal: Negative.   Genitourinary: Negative.   Musculoskeletal: Negative.   Skin: Negative.   Neurological: Negative.   Endo/Heme/Allergies: Negative.   Psychiatric/Behavioral: Negative.      Objective:   There were no vitals filed for this visit.        Physical Exam Constitutional:      Appearance: He is not diaphoretic.  HENT:     Head: Normocephalic.     Right Ear: Tympanic membrane, ear canal and external ear normal.     Left Ear: Tympanic membrane, ear canal and external ear normal.     Nose: Nose normal. No mucosal edema or rhinorrhea.     Mouth/Throat:     Pharynx: Uvula midline. No oropharyngeal exudate.  Eyes:     Conjunctiva/sclera: Conjunctivae normal.  Neck:     Thyroid: No thyromegaly.     Trachea: Trachea normal. No tracheal tenderness or tracheal deviation.  Cardiovascular:     Rate and Rhythm: Normal rate and regular rhythm.     Heart sounds: Normal heart sounds, S1 normal and S2 normal. No murmur.  Pulmonary:     Effort: No respiratory distress.     Breath sounds: Normal breath sounds. No stridor. No wheezing or rales.  Lymphadenopathy:     Head:  Right side of head: No tonsillar adenopathy.     Left side of head: No tonsillar adenopathy.     Cervical: No cervical adenopathy.  Skin:    Findings: No erythema or rash.     Nails: There is no clubbing.  Neurological:     Mental Status: He is alert.     Diagnostics: none  Assessment and Plan:   1. Anaphylaxis due to hymenoptera venom, accidental or unintentional, subsequent encounter   2. Anaphylactic shock due to food, subsequent encounter   3. Aspirin adverse reaction, subsequent encounter     1.  Continue immunotherapy and EpiPen  2.  Return to clinic in 1  year or earlier if problem  3.  Obtain COVID vaccine  Overall Kerin is really doing quite well and we will continue to have him use immunotherapy directed against hymenoptera venom and we will see him back in this clinic in 1 year or earlier if there is a problem.  [name], MD Allergy / Immunology [place]

## 2019-05-20 ENCOUNTER — Encounter: Payer: Self-pay | Admitting: Allergy and Immunology

## 2019-05-24 ENCOUNTER — Ambulatory Visit: Payer: Self-pay

## 2019-07-14 ENCOUNTER — Other Ambulatory Visit: Payer: Self-pay

## 2019-07-14 ENCOUNTER — Ambulatory Visit (INDEPENDENT_AMBULATORY_CARE_PROVIDER_SITE_OTHER): Payer: Medicare Other | Admitting: *Deleted

## 2019-07-14 DIAGNOSIS — T63481D Toxic effect of venom of other arthropod, accidental (unintentional), subsequent encounter: Secondary | ICD-10-CM

## 2019-07-14 DIAGNOSIS — T782XXD Anaphylactic shock, unspecified, subsequent encounter: Secondary | ICD-10-CM

## 2019-09-08 ENCOUNTER — Other Ambulatory Visit: Payer: Self-pay

## 2019-09-08 ENCOUNTER — Ambulatory Visit (INDEPENDENT_AMBULATORY_CARE_PROVIDER_SITE_OTHER): Payer: Medicare Other | Admitting: *Deleted

## 2019-09-08 DIAGNOSIS — T782XXD Anaphylactic shock, unspecified, subsequent encounter: Secondary | ICD-10-CM

## 2019-09-08 DIAGNOSIS — T63481D Toxic effect of venom of other arthropod, accidental (unintentional), subsequent encounter: Secondary | ICD-10-CM | POA: Diagnosis not present

## 2019-09-29 ENCOUNTER — Other Ambulatory Visit: Payer: Self-pay | Admitting: Radiology

## 2019-09-29 DIAGNOSIS — R972 Elevated prostate specific antigen [PSA]: Secondary | ICD-10-CM

## 2019-11-01 ENCOUNTER — Ambulatory Visit
Admission: RE | Admit: 2019-11-01 | Discharge: 2019-11-01 | Disposition: A | Discharge status: Home / Self Care | Payer: Medicare Other | Source: Ambulatory Visit | Attending: Radiology | Admitting: Radiology

## 2019-11-01 DIAGNOSIS — R972 Elevated prostate specific antigen [PSA]: Secondary | ICD-10-CM

## 2019-11-01 MED ORDER — GADOBENATE DIMEGLUMINE 529 MG/ML IV SOLN
20.0000 mL | Freq: Once | INTRAVENOUS | Status: AC | PRN
  Administered 2019-11-01: 20 mL via INTRAVENOUS

## 2019-11-03 ENCOUNTER — Other Ambulatory Visit: Payer: Self-pay

## 2019-11-03 ENCOUNTER — Ambulatory Visit (INDEPENDENT_AMBULATORY_CARE_PROVIDER_SITE_OTHER): Payer: Medicare Other | Admitting: *Deleted

## 2019-11-03 DIAGNOSIS — T782XXD Anaphylactic shock, unspecified, subsequent encounter: Secondary | ICD-10-CM | POA: Diagnosis not present

## 2019-11-03 DIAGNOSIS — T63481D Toxic effect of venom of other arthropod, accidental (unintentional), subsequent encounter: Secondary | ICD-10-CM

## 2019-12-28 ENCOUNTER — Other Ambulatory Visit: Payer: Self-pay

## 2019-12-28 ENCOUNTER — Ambulatory Visit (INDEPENDENT_AMBULATORY_CARE_PROVIDER_SITE_OTHER): Payer: Medicare Other

## 2019-12-28 DIAGNOSIS — T63441D Toxic effect of venom of bees, accidental (unintentional), subsequent encounter: Secondary | ICD-10-CM | POA: Diagnosis not present

## 2019-12-28 DIAGNOSIS — T63481D Toxic effect of venom of other arthropod, accidental (unintentional), subsequent encounter: Secondary | ICD-10-CM

## 2019-12-28 DIAGNOSIS — T782XXD Anaphylactic shock, unspecified, subsequent encounter: Secondary | ICD-10-CM

## 2019-12-29 ENCOUNTER — Ambulatory Visit: Payer: Medicare Other

## 2020-02-22 ENCOUNTER — Other Ambulatory Visit: Payer: Self-pay

## 2020-02-22 ENCOUNTER — Ambulatory Visit (INDEPENDENT_AMBULATORY_CARE_PROVIDER_SITE_OTHER): Payer: Medicare Other | Admitting: *Deleted

## 2020-02-22 DIAGNOSIS — T63481D Toxic effect of venom of other arthropod, accidental (unintentional), subsequent encounter: Secondary | ICD-10-CM

## 2020-02-22 DIAGNOSIS — T782XXD Anaphylactic shock, unspecified, subsequent encounter: Secondary | ICD-10-CM

## 2020-04-18 ENCOUNTER — Other Ambulatory Visit: Payer: Self-pay

## 2020-04-18 ENCOUNTER — Ambulatory Visit (INDEPENDENT_AMBULATORY_CARE_PROVIDER_SITE_OTHER): Payer: Medicare Other

## 2020-04-18 DIAGNOSIS — T782XXD Anaphylactic shock, unspecified, subsequent encounter: Secondary | ICD-10-CM

## 2020-04-18 DIAGNOSIS — T63481D Toxic effect of venom of other arthropod, accidental (unintentional), subsequent encounter: Secondary | ICD-10-CM

## 2020-06-13 ENCOUNTER — Other Ambulatory Visit: Payer: Self-pay

## 2020-06-13 ENCOUNTER — Ambulatory Visit (INDEPENDENT_AMBULATORY_CARE_PROVIDER_SITE_OTHER): Payer: Medicare Other

## 2020-06-13 DIAGNOSIS — T782XXD Anaphylactic shock, unspecified, subsequent encounter: Secondary | ICD-10-CM | POA: Diagnosis not present

## 2020-06-13 DIAGNOSIS — T63481D Toxic effect of venom of other arthropod, accidental (unintentional), subsequent encounter: Secondary | ICD-10-CM

## 2020-08-07 ENCOUNTER — Ambulatory Visit (INDEPENDENT_AMBULATORY_CARE_PROVIDER_SITE_OTHER): Payer: Medicare Other | Admitting: Allergy and Immunology

## 2020-08-07 ENCOUNTER — Encounter: Payer: Self-pay | Admitting: Allergy and Immunology

## 2020-08-07 ENCOUNTER — Ambulatory Visit: Payer: Medicare Other

## 2020-08-07 ENCOUNTER — Other Ambulatory Visit: Payer: Self-pay

## 2020-08-07 VITALS — BP 130/82 | HR 82 | Resp 16 | Ht 72.0 in | Wt 236.2 lb

## 2020-08-07 DIAGNOSIS — T782XXD Anaphylactic shock, unspecified, subsequent encounter: Secondary | ICD-10-CM | POA: Diagnosis not present

## 2020-08-07 DIAGNOSIS — T39015D Adverse effect of aspirin, subsequent encounter: Secondary | ICD-10-CM | POA: Diagnosis not present

## 2020-08-07 DIAGNOSIS — T7800XD Anaphylactic reaction due to unspecified food, subsequent encounter: Secondary | ICD-10-CM

## 2020-08-07 DIAGNOSIS — T63481D Toxic effect of venom of other arthropod, accidental (unintentional), subsequent encounter: Secondary | ICD-10-CM

## 2020-08-07 NOTE — Progress Notes (Unsigned)
[facility] [place] [facility] [place]   Follow-up Note  Referring Provider: Myrlene Broker, MD Primary Provider: Myrlene Broker, MD Date of Office Visit: 08/07/2020  Subjective:   Ruben Rice. (DOB: 03/08/1946) is a 75 y.o. male who returns to the [facility] on 08/07/2020 in re-evaluation of the following:  HPI: Remigio returns to this clinic in evaluation of hymenoptera venom hypersensitivity, history of food allergy against shellfish, and aspirin hypersensitivity with anaphylaxis.  His last visit to this clinic was 19 May 2019.  Overall he has done well regarding his atopic disease while using immunotherapy directed against hymenoptera venom currently at every 8 weeks without any adverse effect.  He has not been stung in 8 years.  He has an injectable epinephrine device.  He remains away from shellfish and aspirin consumption.  He has been receiving therapy for prostate cancer with Lupron for at least 2 years and he has undergone 39 treatments of radiation with a linear accelerator and apparently his posttreatment PSA is undetectable.  He has received 3 Moderna Covid vaccines.  Allergies as of 08/07/2020      Reactions   Aspirin Anaphylaxis   Mixed Vespid Venom Anaphylaxis   Shellfish Allergy Anaphylaxis   Wasp Venom Anaphylaxis      Medication List      amLODipine 5 MG tablet Commonly known as: NORVASC   EPINEPHrine 0.3 mg/0.3 mL Soaj injection Commonly known as: EPI-PEN Use for life threatening allergic reactions   pantoprazole 40 MG tablet Commonly known as: PROTONIX   rosuvastatin 5 MG tablet Commonly known as: CRESTOR Take 5 mg by mouth daily.   tamsulosin 0.4 MG Caps capsule Commonly known as: FLOMAX Take 0.4 mg by mouth at bedtime.       Past Medical History:  Diagnosis Date  . Angio-edema     Past Surgical History:  Procedure Laterality Date  . ADENOIDECTOMY    . TONSILLECTOMY      Review of  systems negative except as noted in HPI / PMHx or noted below:  Review of Systems  Constitutional: Negative.   HENT: Negative.   Eyes: Negative.   Respiratory: Negative.   Cardiovascular: Negative.   Gastrointestinal: Negative.   Genitourinary: Negative.   Musculoskeletal: Negative.   Skin: Negative.   Neurological: Negative.   Endo/Heme/Allergies: Negative.   Psychiatric/Behavioral: Negative.      Objective:   Vitals:   08/07/20 1011  BP: 130/82  Pulse: 82  Resp: 16  SpO2: 98%   Height: 6' (182.9 cm)  Weight: 236 lb 3.2 oz (107.1 kg)   Physical Exam Constitutional:      Appearance: He is not diaphoretic.  HENT:     Head: Normocephalic.     Right Ear: Tympanic membrane, ear canal and external ear normal.     Left Ear: Tympanic membrane, ear canal and external ear normal.     Nose: Nose normal. No mucosal edema or rhinorrhea.     Mouth/Throat:     Pharynx: Uvula midline. No oropharyngeal exudate.  Eyes:     Conjunctiva/sclera: Conjunctivae normal.  Neck:     Thyroid: No thyromegaly.     Trachea: Trachea normal. No tracheal tenderness or tracheal deviation.  Cardiovascular:     Rate and Rhythm: Normal rate and regular rhythm.     Heart sounds: Normal heart sounds, S1 normal and S2 normal. No murmur heard.   Pulmonary:     Effort: No respiratory distress.  Breath sounds: Normal breath sounds. No stridor. No wheezing or rales.  Lymphadenopathy:     Head:     Right side of head: No tonsillar adenopathy.     Left side of head: No tonsillar adenopathy.     Cervical: No cervical adenopathy.  Skin:    Findings: No erythema or rash.     Nails: There is no clubbing.  Neurological:     Mental Status: He is alert.     Diagnostics: none  Assessment and Plan:   1. Anaphylaxis due to hymenoptera venom, accidental or unintentional, subsequent encounter   2. Anaphylactic shock due to food, subsequent encounter   3. Aspirin adverse reaction, subsequent  encounter     1.  Continue immunotherapy and EpiPen  2.  Return to clinic in 1 year or earlier if problem  Clearence appears to be doing very well on his current plan and we will continue to have him receive immunotherapy directed against hymenoptera venom and of course he will perform avoidance measures against specific food products and aspirin.  I will see him back in his clinic in 1 year or earlier if there is a problem.  [name], MD Allergy / Immunology [place]

## 2020-08-07 NOTE — Patient Instructions (Addendum)
  1.  Continue immunotherapy and EpiPen  2.  Return to clinic in 1 year or earlier if problem

## 2020-08-08 ENCOUNTER — Encounter: Payer: Self-pay | Admitting: Allergy and Immunology

## 2020-08-08 ENCOUNTER — Ambulatory Visit: Payer: Medicare Other

## 2020-10-02 ENCOUNTER — Ambulatory Visit (INDEPENDENT_AMBULATORY_CARE_PROVIDER_SITE_OTHER): Payer: Medicare Other

## 2020-10-02 DIAGNOSIS — T63481D Toxic effect of venom of other arthropod, accidental (unintentional), subsequent encounter: Secondary | ICD-10-CM

## 2020-10-02 DIAGNOSIS — T782XXD Anaphylactic shock, unspecified, subsequent encounter: Secondary | ICD-10-CM

## 2020-11-27 ENCOUNTER — Ambulatory Visit: Payer: Medicare Other

## 2020-12-04 ENCOUNTER — Other Ambulatory Visit: Payer: Self-pay

## 2020-12-04 ENCOUNTER — Ambulatory Visit (INDEPENDENT_AMBULATORY_CARE_PROVIDER_SITE_OTHER): Payer: Medicare Other | Admitting: *Deleted

## 2020-12-04 DIAGNOSIS — T782XXD Anaphylactic shock, unspecified, subsequent encounter: Secondary | ICD-10-CM | POA: Diagnosis not present

## 2020-12-04 DIAGNOSIS — T63481D Toxic effect of venom of other arthropod, accidental (unintentional), subsequent encounter: Secondary | ICD-10-CM

## 2021-01-29 ENCOUNTER — Ambulatory Visit (INDEPENDENT_AMBULATORY_CARE_PROVIDER_SITE_OTHER): Payer: Medicare Other

## 2021-01-29 ENCOUNTER — Other Ambulatory Visit: Payer: Self-pay

## 2021-01-29 DIAGNOSIS — T782XXD Anaphylactic shock, unspecified, subsequent encounter: Secondary | ICD-10-CM | POA: Diagnosis not present

## 2021-01-29 DIAGNOSIS — T63481D Toxic effect of venom of other arthropod, accidental (unintentional), subsequent encounter: Secondary | ICD-10-CM

## 2021-03-26 ENCOUNTER — Ambulatory Visit (INDEPENDENT_AMBULATORY_CARE_PROVIDER_SITE_OTHER): Payer: Medicare Other | Admitting: *Deleted

## 2021-03-26 ENCOUNTER — Other Ambulatory Visit: Payer: Self-pay

## 2021-03-26 DIAGNOSIS — T63481D Toxic effect of venom of other arthropod, accidental (unintentional), subsequent encounter: Secondary | ICD-10-CM

## 2021-03-26 DIAGNOSIS — T782XXD Anaphylactic shock, unspecified, subsequent encounter: Secondary | ICD-10-CM

## 2021-04-10 ENCOUNTER — Other Ambulatory Visit: Payer: Self-pay | Admitting: Allergy and Immunology

## 2021-05-21 ENCOUNTER — Other Ambulatory Visit: Payer: Self-pay

## 2021-05-21 ENCOUNTER — Ambulatory Visit (INDEPENDENT_AMBULATORY_CARE_PROVIDER_SITE_OTHER): Payer: Medicare Other | Admitting: *Deleted

## 2021-05-21 DIAGNOSIS — T782XXD Anaphylactic shock, unspecified, subsequent encounter: Secondary | ICD-10-CM

## 2021-05-21 DIAGNOSIS — T63481D Toxic effect of venom of other arthropod, accidental (unintentional), subsequent encounter: Secondary | ICD-10-CM

## 2021-07-16 ENCOUNTER — Other Ambulatory Visit: Payer: Self-pay

## 2021-07-16 ENCOUNTER — Ambulatory Visit (INDEPENDENT_AMBULATORY_CARE_PROVIDER_SITE_OTHER): Payer: Medicare Other | Admitting: *Deleted

## 2021-07-16 DIAGNOSIS — T782XXD Anaphylactic shock, unspecified, subsequent encounter: Secondary | ICD-10-CM

## 2021-07-16 DIAGNOSIS — T63481D Toxic effect of venom of other arthropod, accidental (unintentional), subsequent encounter: Secondary | ICD-10-CM | POA: Diagnosis not present

## 2021-09-10 ENCOUNTER — Encounter: Payer: Self-pay | Admitting: Allergy and Immunology

## 2021-09-10 ENCOUNTER — Ambulatory Visit: Payer: Medicare Other

## 2021-09-10 ENCOUNTER — Ambulatory Visit (INDEPENDENT_AMBULATORY_CARE_PROVIDER_SITE_OTHER): Payer: Medicare Other | Admitting: Allergy and Immunology

## 2021-09-10 VITALS — BP 146/84 | HR 76 | Resp 16 | Ht 71.0 in | Wt 235.0 lb

## 2021-09-10 DIAGNOSIS — T7800XD Anaphylactic reaction due to unspecified food, subsequent encounter: Secondary | ICD-10-CM

## 2021-09-10 DIAGNOSIS — T63481D Toxic effect of venom of other arthropod, accidental (unintentional), subsequent encounter: Secondary | ICD-10-CM

## 2021-09-10 DIAGNOSIS — T782XXD Anaphylactic shock, unspecified, subsequent encounter: Secondary | ICD-10-CM | POA: Diagnosis not present

## 2021-09-10 DIAGNOSIS — T39015D Adverse effect of aspirin, subsequent encounter: Secondary | ICD-10-CM

## 2021-09-10 NOTE — Progress Notes (Signed)
? ?[facility] ? ? ?Follow-up Note ? ?Referring Provider: Myrlene Broker, MD ?Primary Provider: Myrlene Broker, MD ?Date of Office Visit: 09/10/2021 ? ?Subjective:  ? ?Ruben Rice. (DOB: 1945/06/16) is a 76 y.o. male who returns to the [facility] on 09/10/2021 in re-evaluation of the following: ? ?HPI: Frantz returns to this clinic in reevaluation of hymenoptera venom hypersensitivity, shellfish allergy, and aspirin hypersensitivity.  His last visit to this clinic was 07 August 2020. ? ?His immunotherapy is going quite well currently at every 8 weeks directed against hymenoptera venom.  He does have an EpiPen. ? ?He does not consume shellfish or aspirin. ? ?Allergies as of 09/10/2021   ? ?   Reactions  ? Aspirin Anaphylaxis  ? Lisinopril Swelling  ? Other reaction(s): Lip swelling  ? Mixed Vespid Venom Anaphylaxis  ? Shellfish Allergy Anaphylaxis  ? Wasp Venom Anaphylaxis  ? ?  ? ?  ?Medication List  ? ? ?amLODipine 5 MG tablet ?Commonly known as: NORVASC ?Take 1 tablet by mouth daily. ?  ?EPINEPHrine 0.3 mg/0.3 mL Soaj injection ?Commonly known as: EPI-PEN ?USE FOR LIFE THREATENING ALLERGIC REACTIONS ?  ?Leuprolide Acetate 5 MG/ML Kit ?Inject into the skin every 6 (six) months. ?  ?pantoprazole 40 MG tablet ?Commonly known as: PROTONIX ?Take 1 tablet by mouth daily. ?  ?tamsulosin 0.4 MG Caps capsule ?Commonly known as: FLOMAX ?Take 0.4 mg by mouth at bedtime. ?  ? ?Past Medical History:  ?Diagnosis Date  ? Angio-edema   ? Hymenoptera allergy   ? ? ?Past Surgical History:  ?Procedure Laterality Date  ? ADENOIDECTOMY    ? TONSILLECTOMY    ? ? ?Review of systems negative except as noted in HPI / PMHx or noted below: ? ?Review of Systems  ?Constitutional: Negative.   ?HENT: Negative.    ?Eyes: Negative.   ?Respiratory: Negative.    ?Cardiovascular: Negative.   ?Gastrointestinal: Negative.   ?Genitourinary: Negative.   ?Musculoskeletal: Negative.    ?Skin: Negative.   ?Neurological: Negative.   ?Endo/Heme/Allergies: Negative.   ?Psychiatric/Behavioral: Negative.    ? ? ?Objective:  ? ?Vitals:  ? 09/10/21 0841  ?BP: (!) 146/84  ?Pulse: 76  ?Resp: 16  ?SpO2: 97%  ? ?Height: '5\' 11"'  (180.3 cm)  ?Weight: 235 lb (106.6 kg)  ? ?Physical Exam ?Constitutional:   ?   Appearance: He is not diaphoretic.  ?HENT:  ?   Head: Normocephalic.  ?   Right Ear: Tympanic membrane, ear canal and external ear normal.  ?   Left Ear: Tympanic membrane, ear canal and external ear normal.  ?   Nose: Nose normal. No mucosal edema or rhinorrhea.  ?   Mouth/Throat:  ?   Pharynx: Uvula midline. No oropharyngeal exudate.  ?Eyes:  ?   Conjunctiva/sclera: Conjunctivae normal.  ?Neck:  ?   Thyroid: No thyromegaly.  ?   Trachea: Trachea normal. No tracheal tenderness or tracheal deviation.  ?Cardiovascular:  ?   Rate and Rhythm: Normal rate and regular rhythm.  ?   Heart sounds: Normal heart sounds, S1 normal and S2 normal. No murmur heard. ?Pulmonary:  ?   Effort: No respiratory distress.  ?   Breath sounds: Normal breath sounds. No stridor. No wheezing or rales.  ?Lymphadenopathy:  ?   Head:  ?   Right side of head: No tonsillar adenopathy.  ?   Left side of head: No tonsillar adenopathy.  ?  Cervical: No cervical adenopathy.  ?Skin: ?   Findings: No erythema or rash.  ?   Nails: There is no clubbing.  ?Neurological:  ?   Mental Status: He is alert.  ? ? ?Diagnostics: none ? ?Assessment and Plan:  ? ?1. Anaphylaxis due to hymenoptera venom, accidental or unintentional, subsequent encounter   ?2. Anaphylactic shock due to food, subsequent encounter   ?3. Aspirin adverse reaction, subsequent encounter   ? ? ?1.  Continue immunotherapy and EpiPen ? ?2.  Return to clinic in 1 year or earlier if problem ? ?Shean appears to be doing very well on his immunotherapy directed against hymenoptera venom and performing avoidance measures directed against shellfish and aspirin.  We will see him back in  this clinic in 1 year or earlier if there is a problem. ? ?[name], MD ?Allergy / Immunology ?[place] ?

## 2021-09-10 NOTE — Patient Instructions (Signed)
?  1.  Continue immunotherapy and EpiPen ? ?2.  Return to clinic in 1 year or earlier if problem ? ?

## 2021-09-11 ENCOUNTER — Encounter: Payer: Self-pay | Admitting: Allergy and Immunology

## 2021-11-05 ENCOUNTER — Ambulatory Visit (INDEPENDENT_AMBULATORY_CARE_PROVIDER_SITE_OTHER): Payer: Medicare Other | Admitting: *Deleted

## 2021-11-05 DIAGNOSIS — T782XXD Anaphylactic shock, unspecified, subsequent encounter: Secondary | ICD-10-CM

## 2021-11-05 DIAGNOSIS — T63481D Toxic effect of venom of other arthropod, accidental (unintentional), subsequent encounter: Secondary | ICD-10-CM

## 2021-12-31 ENCOUNTER — Ambulatory Visit (INDEPENDENT_AMBULATORY_CARE_PROVIDER_SITE_OTHER): Payer: Medicare Other | Admitting: *Deleted

## 2021-12-31 DIAGNOSIS — T63481D Toxic effect of venom of other arthropod, accidental (unintentional), subsequent encounter: Secondary | ICD-10-CM

## 2021-12-31 DIAGNOSIS — T782XXD Anaphylactic shock, unspecified, subsequent encounter: Secondary | ICD-10-CM

## 2022-01-27 IMAGING — MR MR PROSTATE WO/W CM
56 series · 56 of 56 positions shown · IV contrast (multihance)
Comparison: None.

CLINICAL DATA: PSA equal 47.

EXAM:
MR PROSTATE WITHOUT AND WITH CONTRAST
TECHNIQUE: Multiplanar multisequence MRI images were obtained of the pelvis
centered about the prostate. Pre and post contrast images were
obtained.
CONTRAST:  20mL MULTIHANCE GADOBENATE DIMEGLUMINE 529 MG/ML IV SOLN

[Series 3: bSSFP fat-sat · axial · 8.0mm · 0.74mm/px · 1 of 28 slices shown]
[im 1/28]
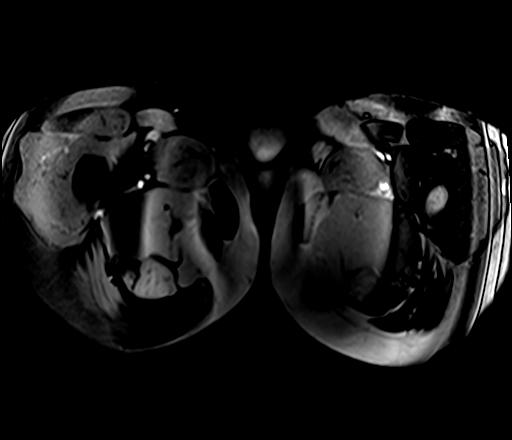

[Series 4: T1 · axial · 5.0mm · 1.25mm/px · 1 of 80 slices shown]
[im 1/80]
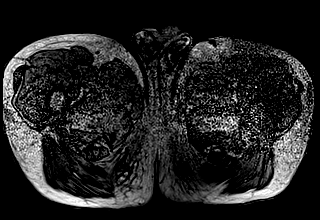

[Series 5: T2 · coronal · 3.5mm · 0.56mm/px · 1 of 23 slices shown (1 of 3)]
[im 1/23]
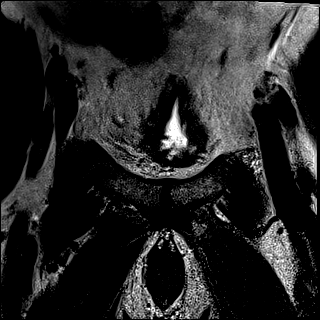

[Series 6: DWI · axial · 3.5mm · 1.75mm/px · 1 of 66 slices shown (1 of 3)]
[im 1/66]
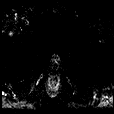

[Series 7: DWI · axial · 3.5mm · 1.75mm/px · 1 of 22 slices shown (2 of 3)]
[im 1/22]
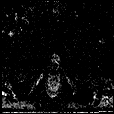

[Series 8: DWI · axial · 3.5mm · 1.56mm/px · 1 of 22 slices shown (3 of 3)]
[im 1/22]
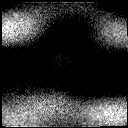

[Series 9: T2 · axial · 3.5mm · 0.56mm/px · 1 of 23 slices shown (2 of 3)]
[im 1/23]
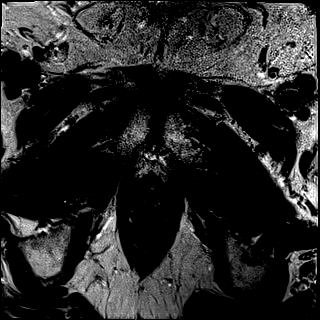

[Series 10: T2 · axial · 1.0mm · 1.04mm/px · 1 of 80 slices shown (3 of 3)]
[im 1/80]
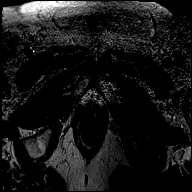

[Series 11: pre t1_twist_tra_dyn_ttc=6.4s · axial · non-contrast · 3.5mm · 0.83mm/px · 1 of 24 slices shown]
[im 1/24]
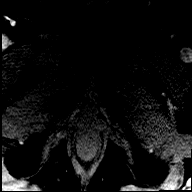

[Series 12: post t1_twist_tra_dyn-copy center · axial · 3.5mm · 0.83mm/px · 1 of 24 slices shown (1 of 24)]
[im 1/24]
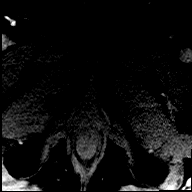

[Series 13: post t1_twist_tra_dyn-copy center · axial · 3.5mm · 0.83mm/px · 1 of 24 slices shown (2 of 24)]
[im 1/24]
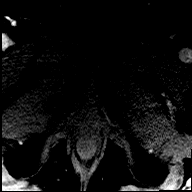

[Series 14: post t1_twist_tra_dyn-copy cent_sub_ttc=(id) · axial · 3.5mm · 0.83mm/px · 1 of 24 slices shown (1 of 23)]
[im 1/24]
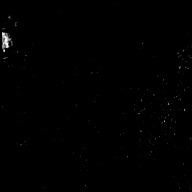

[Series 15: post t1_twist_tra_dyn-copy center · axial · 3.5mm · 0.83mm/px · 1 of 24 slices shown (3 of 24)]
[im 1/24]
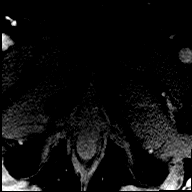

[Series 16: post t1_twist_tra_dyn-copy cent_sub_ttc=(id) · axial · 3.5mm · 0.83mm/px · 1 of 24 slices shown (2 of 23)]
[im 1/24]
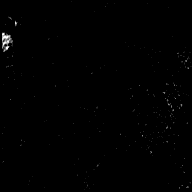

[Series 17: post t1_twist_tra_dyn-copy center · axial · 3.5mm · 0.83mm/px · 1 of 24 slices shown (4 of 24)]
[im 1/24]
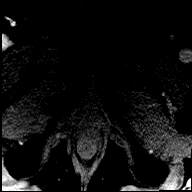

[Series 18: post t1_twist_tra_dyn-copy cent_sub_ttc=(id) · axial · 3.5mm · 0.83mm/px · 1 of 24 slices shown (3 of 23)]
[im 1/24]
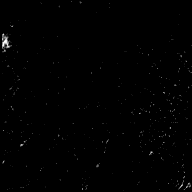

[Series 19: post t1_twist_tra_dyn-copy center · axial · 3.5mm · 0.83mm/px · 1 of 24 slices shown (5 of 24)]
[im 1/24]
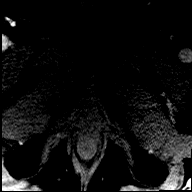

[Series 20: post t1_twist_tra_dyn-copy cent_sub_ttc=(id) · axial · 3.5mm · 0.83mm/px · 1 of 24 slices shown (4 of 23)]
[im 1/24]
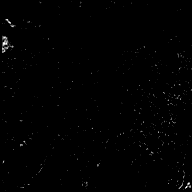

[Series 21: post t1_twist_tra_dyn-copy center · axial · 3.5mm · 0.83mm/px · 1 of 24 slices shown (6 of 24)]
[im 1/24]
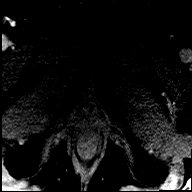

[Series 22: post t1_twist_tra_dyn-copy cent_sub_ttc=(id) · axial · 3.5mm · 0.83mm/px · 1 of 24 slices shown (5 of 23)]
[im 1/24]
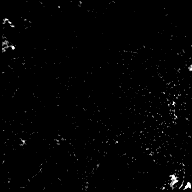

[Series 23: post t1_twist_tra_dyn-copy center · axial · 3.5mm · 0.83mm/px · 1 of 24 slices shown (7 of 24)]
[im 1/24]
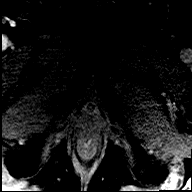

[Series 24: post t1_twist_tra_dyn-copy cent_sub_ttc=(id) · axial · 3.5mm · 0.83mm/px · 1 of 24 slices shown (6 of 23)]
[im 1/24]
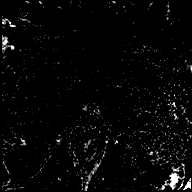

[Series 25: post t1_twist_tra_dyn-copy center · axial · 3.5mm · 0.83mm/px · 1 of 24 slices shown (8 of 24)]
[im 1/24]
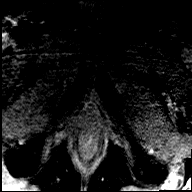

[Series 26: post t1_twist_tra_dyn-copy cent_sub_ttc=(id) · axial · 3.5mm · 0.83mm/px · 1 of 24 slices shown (7 of 23)]
[im 1/24]
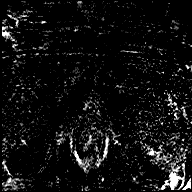

[Series 27: post t1_twist_tra_dyn-copy center · axial · 3.5mm · 0.83mm/px · 1 of 24 slices shown (9 of 24)]
[im 1/24]
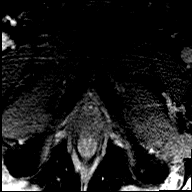

[Series 28: post t1_twist_tra_dyn-copy cent_sub_ttc=(id) · axial · 3.5mm · 0.83mm/px · 1 of 24 slices shown (8 of 23)]
[im 1/24]
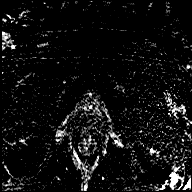

[Series 29: post t1_twist_tra_dyn-copy center · axial · 3.5mm · 0.83mm/px · 1 of 24 slices shown (10 of 24)]
[im 1/24]
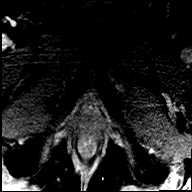

[Series 30: post t1_twist_tra_dyn-copy cent_sub_ttc=(id) · axial · 3.5mm · 0.83mm/px · 1 of 24 slices shown (9 of 23)]
[im 1/24]
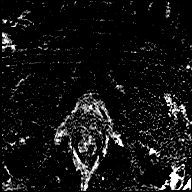

[Series 31: post t1_twist_tra_dyn-copy center · axial · 3.5mm · 0.83mm/px · 1 of 24 slices shown (11 of 24)]
[im 1/24]
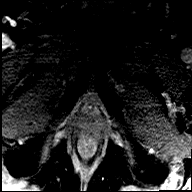

[Series 32: post t1_twist_tra_dyn-copy cent_sub_ttc=(id) · axial · 3.5mm · 0.83mm/px · 1 of 24 slices shown (10 of 23)]
[im 1/24]
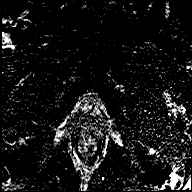

[Series 33: post t1_twist_tra_dyn-copy center · axial · 3.5mm · 0.83mm/px · 1 of 24 slices shown (12 of 24)]
[im 1/24]
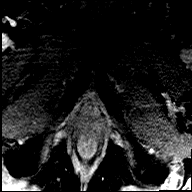

[Series 34: post t1_twist_tra_dyn-copy cent_sub_ttc=(id) · axial · 3.5mm · 0.83mm/px · 1 of 24 slices shown (11 of 23)]
[im 1/24]
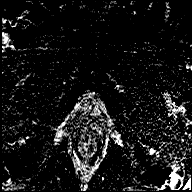

[Series 35: post t1_twist_tra_dyn-copy center · axial · 3.5mm · 0.83mm/px · 1 of 24 slices shown (13 of 24)]
[im 1/24]
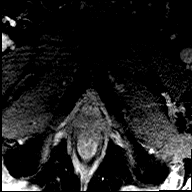

[Series 36: post t1_twist_tra_dyn-copy cent_sub_ttc=(id) · axial · 3.5mm · 0.83mm/px · 1 of 24 slices shown (12 of 23)]
[im 1/24]
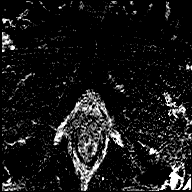

[Series 37: post t1_twist_tra_dyn-copy center · axial · 3.5mm · 0.83mm/px · 1 of 24 slices shown (14 of 24)]
[im 1/24]
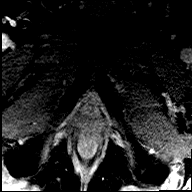

[Series 38: post t1_twist_tra_dyn-copy cent_sub_ttc=(id) · axial · 3.5mm · 0.83mm/px · 1 of 24 slices shown (13 of 23)]
[im 1/24]
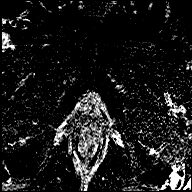

[Series 39: post t1_twist_tra_dyn-copy center · axial · 3.5mm · 0.83mm/px · 1 of 24 slices shown (15 of 24)]
[im 1/24]
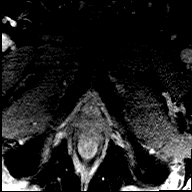

[Series 40: post t1_twist_tra_dyn-copy cent_sub_ttc=(id) · axial · 3.5mm · 0.83mm/px · 1 of 24 slices shown (14 of 23)]
[im 1/24]
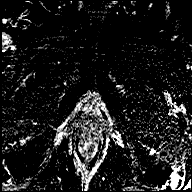

[Series 41: post t1_twist_tra_dyn-copy center · axial · 3.5mm · 0.83mm/px · 1 of 24 slices shown (16 of 24)]
[im 1/24]
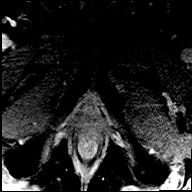

[Series 42: post t1_twist_tra_dyn-copy cent_sub_ttc=(id) · axial · 3.5mm · 0.83mm/px · 1 of 24 slices shown (15 of 23)]
[im 1/24]
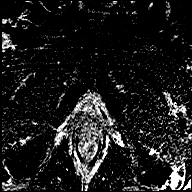

[Series 43: post t1_twist_tra_dyn-copy center · axial · 3.5mm · 0.83mm/px · 1 of 24 slices shown (17 of 24)]
[im 1/24]
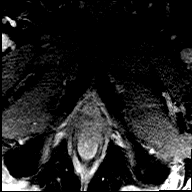

[Series 44: post t1_twist_tra_dyn-copy cent_sub_ttc=(id) · axial · 3.5mm · 0.83mm/px · 1 of 24 slices shown (16 of 23)]
[im 1/24]
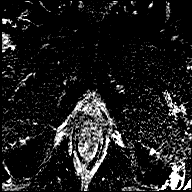

[Series 45: post t1_twist_tra_dyn-copy center · axial · 3.5mm · 0.83mm/px · 1 of 24 slices shown (18 of 24)]
[im 1/24]
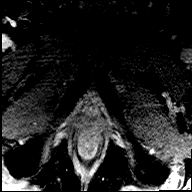

[Series 46: post t1_twist_tra_dyn-copy cent_sub_ttc=(id) · axial · 3.5mm · 0.83mm/px · 1 of 24 slices shown (17 of 23)]
[im 1/24]
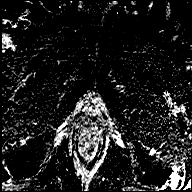

[Series 47: post t1_twist_tra_dyn-copy center · axial · 3.5mm · 0.83mm/px · 1 of 24 slices shown (19 of 24)]
[im 1/24]
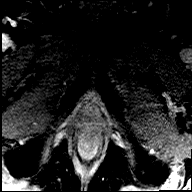

[Series 48: post t1_twist_tra_dyn-copy cent_sub_ttc=(id) · axial · 3.5mm · 0.83mm/px · 1 of 24 slices shown (18 of 23)]
[im 1/24]
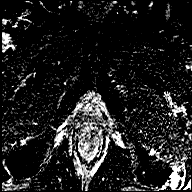

[Series 49: post t1_twist_tra_dyn-copy center · axial · 3.5mm · 0.83mm/px · 1 of 24 slices shown (20 of 24)]
[im 1/24]
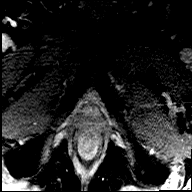

[Series 50: post t1_twist_tra_dyn-copy cent_sub_ttc=(id) · axial · 3.5mm · 0.83mm/px · 1 of 24 slices shown (19 of 23)]
[im 1/24]
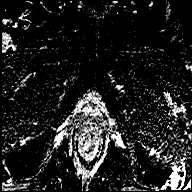

[Series 51: post t1_twist_tra_dyn-copy center · axial · 3.5mm · 0.83mm/px · 1 of 24 slices shown (21 of 24)]
[im 1/24]
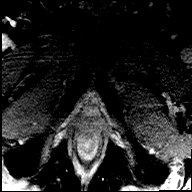

[Series 52: post t1_twist_tra_dyn-copy cent_sub_ttc=(id) · axial · 3.5mm · 0.83mm/px · 1 of 24 slices shown (20 of 23)]
[im 1/24]
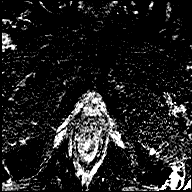

[Series 53: post t1_twist_tra_dyn-copy center · axial · 3.5mm · 0.83mm/px · 1 of 24 slices shown (22 of 24)]
[im 1/24]
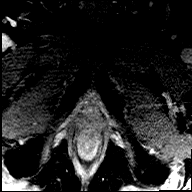

[Series 54: post t1_twist_tra_dyn-copy cent_sub_ttc=(id) · axial · 3.5mm · 0.83mm/px · 1 of 24 slices shown (21 of 23)]
[im 1/24]
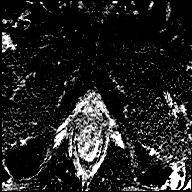

[Series 55: post t1_twist_tra_dyn-copy center · axial · 3.5mm · 0.83mm/px · 1 of 24 slices shown (23 of 24)]
[im 1/24]
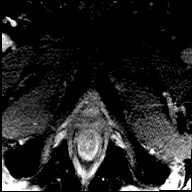

[Series 56: post t1_twist_tra_dyn-copy cent_sub_ttc=(id) · axial · 3.5mm · 0.83mm/px · 1 of 24 slices shown (22 of 23)]
[im 1/24]
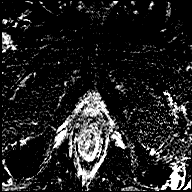

[Series 57: post t1_twist_tra_dyn-copy center · axial · 3.5mm · 0.83mm/px · 1 of 24 slices shown (24 of 24)]
[im 1/24]
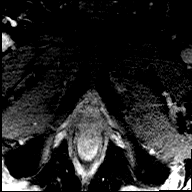

[Series 58: post t1_twist_tra_dyn-copy cent_sub_ttc=(id) · axial · 3.5mm · 0.83mm/px · 1 of 24 slices shown (23 of 23)]
[im 1/24]
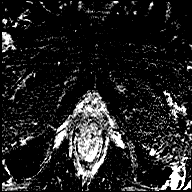

[56 of 56 positions shown; findings below may reference images not displayed]

FINDINGS: Prostate: There is a bland mass on T2 weighted imaging which appears
to be centered within the basilar aspect of the RIGHT transitional
zone but expands beyond the typical margin of the prostatic capsule.
The mass measures 4.6 by 4.0 by 4.6 cm (volume = 44 cm^3). The
mass extends beyond the upper margins of the prostate gland and
elevates the bladder from the RIGHT side. The mass demonstrates
marked restricted diffusion (image [DATE]).

There is additional smaller focus of loss of signal intensity and
restricted diffusion in the RIGHT peripheral zone measuring 6 mm on
image [DATE] within the RIGHT mid gland peripheral zone.

Volume: RIGHT prostate volume difficult to assess with the RIGHT
prostate mass equal volume to the overall prostate gland. Gland is
estimated at 4.1 x 3.6 by 3.7 cm (volume = 29 cm^3)

The RIGHT gland mass has a volume equal 44 cubic cm

Transcapsular spread: Mass extends clearly extends beyond the
expected margin of the RIGHT gland elevating the bladder and
abutting the operator internus muscle. No clear direct extension of
the mass into these structures.

Seminal vesicle involvement: present/absent/other

Neurovascular bundle involvement: Absent. The above mass is anterior
to the neurovascular bundles.

Pelvic adenopathy: Absent

Bone metastasis: Absent

Other findings: Posterior RIGHT bladder diverticulum measuring 14 mm
on image [DATE]
IMPRESSION: 1. Large mass (44 cubic cm) appears to arise from the mid
transitional zone of the RIGHT gland and extend superior to elevate
the bladder. Mass expands the capsule of the RIGHT gland and
presumably extends beyond the capsule as the mass is equal in volume
to the gland itself. PI-RADS: 5
2. Smaller 6 mm lesion in the mid RIGHT peripheral gland (insert PI
rads 4)
3. No lymphadenopathy evident. Seminal vesicles normal.
Neurovascular bundles intact.

## 2022-02-25 ENCOUNTER — Ambulatory Visit (INDEPENDENT_AMBULATORY_CARE_PROVIDER_SITE_OTHER): Payer: Medicare Other | Admitting: *Deleted

## 2022-02-25 DIAGNOSIS — T63481D Toxic effect of venom of other arthropod, accidental (unintentional), subsequent encounter: Secondary | ICD-10-CM | POA: Diagnosis not present

## 2022-02-25 DIAGNOSIS — T782XXD Anaphylactic shock, unspecified, subsequent encounter: Secondary | ICD-10-CM | POA: Diagnosis not present

## 2022-04-22 ENCOUNTER — Ambulatory Visit (INDEPENDENT_AMBULATORY_CARE_PROVIDER_SITE_OTHER): Payer: Medicare Other | Admitting: *Deleted

## 2022-04-22 DIAGNOSIS — T782XXD Anaphylactic shock, unspecified, subsequent encounter: Secondary | ICD-10-CM

## 2022-04-22 DIAGNOSIS — T63481D Toxic effect of venom of other arthropod, accidental (unintentional), subsequent encounter: Secondary | ICD-10-CM | POA: Diagnosis not present

## 2022-06-17 ENCOUNTER — Ambulatory Visit (INDEPENDENT_AMBULATORY_CARE_PROVIDER_SITE_OTHER): Payer: Medicare Other | Admitting: *Deleted

## 2022-06-17 DIAGNOSIS — T63481D Toxic effect of venom of other arthropod, accidental (unintentional), subsequent encounter: Secondary | ICD-10-CM | POA: Diagnosis not present

## 2022-06-17 DIAGNOSIS — T782XXD Anaphylactic shock, unspecified, subsequent encounter: Secondary | ICD-10-CM | POA: Diagnosis not present

## 2022-08-12 ENCOUNTER — Ambulatory Visit (INDEPENDENT_AMBULATORY_CARE_PROVIDER_SITE_OTHER): Payer: Medicare Other | Admitting: *Deleted

## 2022-08-12 DIAGNOSIS — T782XXD Anaphylactic shock, unspecified, subsequent encounter: Secondary | ICD-10-CM | POA: Diagnosis not present

## 2022-08-12 DIAGNOSIS — T63481D Toxic effect of venom of other arthropod, accidental (unintentional), subsequent encounter: Secondary | ICD-10-CM | POA: Diagnosis not present

## 2022-10-08 ENCOUNTER — Ambulatory Visit: Payer: Medicare Other

## 2022-10-08 ENCOUNTER — Ambulatory Visit (INDEPENDENT_AMBULATORY_CARE_PROVIDER_SITE_OTHER): Payer: Medicare Other | Admitting: *Deleted

## 2022-10-08 DIAGNOSIS — T63481D Toxic effect of venom of other arthropod, accidental (unintentional), subsequent encounter: Secondary | ICD-10-CM

## 2022-10-08 DIAGNOSIS — T782XXD Anaphylactic shock, unspecified, subsequent encounter: Secondary | ICD-10-CM

## 2022-12-03 ENCOUNTER — Ambulatory Visit (INDEPENDENT_AMBULATORY_CARE_PROVIDER_SITE_OTHER): Payer: Medicare Other | Admitting: *Deleted

## 2022-12-03 DIAGNOSIS — T63481D Toxic effect of venom of other arthropod, accidental (unintentional), subsequent encounter: Secondary | ICD-10-CM

## 2022-12-03 DIAGNOSIS — T782XXD Anaphylactic shock, unspecified, subsequent encounter: Secondary | ICD-10-CM

## 2022-12-03 MED ORDER — EPINEPHRINE 0.3 MG/0.3ML IJ SOAJ: INTRAMUSCULAR | 0 refills | Status: DC

## 2022-12-25 ENCOUNTER — Other Ambulatory Visit: Payer: Self-pay | Admitting: Allergy and Immunology

## 2023-01-28 ENCOUNTER — Ambulatory Visit (INDEPENDENT_AMBULATORY_CARE_PROVIDER_SITE_OTHER): Payer: Medicare Other | Admitting: *Deleted

## 2023-01-28 DIAGNOSIS — T63481D Toxic effect of venom of other arthropod, accidental (unintentional), subsequent encounter: Secondary | ICD-10-CM | POA: Diagnosis not present

## 2023-01-28 DIAGNOSIS — T782XXD Anaphylactic shock, unspecified, subsequent encounter: Secondary | ICD-10-CM | POA: Diagnosis not present

## 2023-03-06 ENCOUNTER — Telehealth: Payer: Self-pay | Admitting: Urology

## 2023-03-25 ENCOUNTER — Ambulatory Visit (INDEPENDENT_AMBULATORY_CARE_PROVIDER_SITE_OTHER): Payer: Medicare Other | Admitting: *Deleted

## 2023-03-25 DIAGNOSIS — T782XXD Anaphylactic shock, unspecified, subsequent encounter: Secondary | ICD-10-CM

## 2023-03-25 DIAGNOSIS — T63481D Toxic effect of venom of other arthropod, accidental (unintentional), subsequent encounter: Secondary | ICD-10-CM

## 2023-05-20 ENCOUNTER — Ambulatory Visit: Payer: Medicare Other

## 2023-05-20 DIAGNOSIS — T63481D Toxic effect of venom of other arthropod, accidental (unintentional), subsequent encounter: Secondary | ICD-10-CM

## 2023-05-20 DIAGNOSIS — T782XXD Anaphylactic shock, unspecified, subsequent encounter: Secondary | ICD-10-CM

## 2023-07-15 ENCOUNTER — Ambulatory Visit (INDEPENDENT_AMBULATORY_CARE_PROVIDER_SITE_OTHER): Payer: Medicare Other

## 2023-07-15 DIAGNOSIS — T782XXD Anaphylactic shock, unspecified, subsequent encounter: Secondary | ICD-10-CM

## 2023-07-15 DIAGNOSIS — T63481D Toxic effect of venom of other arthropod, accidental (unintentional), subsequent encounter: Secondary | ICD-10-CM | POA: Diagnosis not present

## 2023-08-27 ENCOUNTER — Encounter: Payer: Self-pay | Admitting: Urology

## 2023-08-27 ENCOUNTER — Ambulatory Visit: Payer: Medicare Other | Admitting: Urology

## 2023-08-27 VITALS — BP 168/93 | HR 77 | Ht 74.0 in | Wt 228.0 lb

## 2023-08-27 DIAGNOSIS — R351 Nocturia: Secondary | ICD-10-CM

## 2023-08-27 DIAGNOSIS — C61 Malignant neoplasm of prostate: Secondary | ICD-10-CM | POA: Diagnosis not present

## 2023-08-27 LAB — URINALYSIS, ROUTINE W REFLEX MICROSCOPIC
Bilirubin, UA: NEGATIVE
Glucose, UA: NEGATIVE
Ketones, UA: NEGATIVE
Leukocytes,UA: NEGATIVE
Nitrite, UA: NEGATIVE
Protein,UA: NEGATIVE
RBC, UA: NEGATIVE
Specific Gravity, UA: 1.015 (ref 1.005–1.030)
Urobilinogen, Ur: 0.2 mg/dL (ref 0.2–1.0)
pH, UA: 6 (ref 5.0–7.5)

## 2023-08-27 NOTE — Progress Notes (Signed)
 Assessment: 1. Prostate cancer (HCC); high risk disease; s/p XRT + ADT   2. Nocturia     Plan: I personally reviewed the patient's chart including provider notes, and lab results. He has had an excellent response to treatment with XRT and ADT.  His PSA remains undetectable. Recommend continued follow-up with PSAs every 6 months. I discussed treatment options for his nocturia.  He would like to continue to monitor at this time. He is not interested in any treatment for his erectile dysfunction. Return to office in 6 months with pre-clinic PSA which will be done by his PCP.  Chief Complaint:  Chief Complaint  Patient presents with   Prostate Cancer    History of Present Illness:  Ruben Rice. is a 78 y.o. male who is seen for evaluation of prostate cancer and nocturia. He has been followed by Dr. Lindley Magnus with [facility]rology in [place] [employer].  His last visit was in October 2024.  He has a history of elevated PSA and had previously had 4 negative biopsies performed in [place].  In 2021, his PSA increased to 79.  Prostate MRI showed a PI-RADS 5 lesion. Prostate biopsy from 8/21 showed Gleason 4+4 = 8 in 4 cores and Gleason 4+5 = 9 in 1 core. He was treated with external beam radiation and ADT.  His last injection was on 08/07/2022. PSA results: 3/22 <0.1 9/22 <0.1 3/23 <0.1 9/23 <0.1 3/24 <0.1 9/24 <0.01 3/25 <0.01  He has had bothersome nocturia.  He does use a CPAP for sleep apnea and has some peripheral edema. He was started on hydrochlorothiazide 12.5 mg daily.  He discontinued continued this due to leg cramps.  He presents today to establish care in our office.  His urinary symptoms remain stable.  He continues to take tamsulosin nightly.  He has nocturia 2-4 times depending on his fluid intake later in the day.  No daytime symptoms.  No dysuria or gross hematuria. IPSS = 7/2. He does have erectile dysfunction but is not interested in any treatment at this time. He is  not having any residual side effects from his prior ADT.   Past Medical History:  Past Medical History:  Diagnosis Date   Angio-edema    Hymenoptera allergy     Past Surgical History:  Past Surgical History:  Procedure Laterality Date   ADENOIDECTOMY     TONSILLECTOMY      Allergies:  Allergies  Allergen Reactions   Aspirin Anaphylaxis   Insect Extract Anaphylaxis   Lisinopril Swelling    Other reaction(s): Lip swelling   Mixed Vespid Venom Anaphylaxis   Shellfish Allergy Anaphylaxis   Wasp Venom Anaphylaxis    Family History:  No family history on file.  Social History:  Social History   Tobacco Use   Smoking status: Never   Smokeless tobacco: Never  Vaping Use   Vaping status: Never Used  Substance Use Topics   Alcohol use: Not Currently   Drug use: Never    Review of symptoms:  Constitutional:  Negative for unexplained weight loss, night sweats, fever, chills ENT:  Negative for nose bleeds, sinus pain, painful swallowing CV:  Negative for chest pain, shortness of breath, exercise intolerance, palpitations, loss of consciousness Resp:  Negative for cough, wheezing, shortness of breath GI:  Negative for nausea, vomiting, diarrhea, bloody stools GU:  Positives noted in HPI; otherwise negative for gross hematuria, dysuria, urinary incontinence Neuro:  Negative for seizures, poor balance, limb weakness, slurred speech Psych:  Negative for lack of energy, depression, anxiety Endocrine:  Negative for polydipsia, polyuria, symptoms of hypoglycemia (dizziness, hunger, sweating) Hematologic:  Negative for anemia, purpura, petechia, prolonged or excessive bleeding, use of anticoagulants  Allergic:  Negative for difficulty breathing or choking as a result of exposure to anything; no shellfish allergy; no allergic response (rash/itch) to materials, foods  Physical exam: BP (!) 168/93   Pulse 77   Ht 6\' 2"  (1.88 m)   Wt 228 lb (103.4 kg)   BMI 29.27 kg/m  GENERAL  APPEARANCE:  Well appearing, well developed, well nourished, NAD HEENT:  Atraumatic, normocephalic, oropharynx clear NECK:  Supple without lymphadenopathy or thyromegaly ABDOMEN:  Soft, non-tender, no masses EXTREMITIES:  Moves all extremities well, without clubbing, cyanosis, or edema NEUROLOGIC:  Alert and oriented x 3, normal gait, CN II-XII grossly intact MENTAL STATUS:  appropriate BACK:  Non-tender to palpation, No CVAT SKIN:  Warm, dry, and intact  Results: U/A:  negative

## 2023-09-02 ENCOUNTER — Encounter: Payer: Medicare Other | Admitting: Urology

## 2023-09-09 ENCOUNTER — Ambulatory Visit (INDEPENDENT_AMBULATORY_CARE_PROVIDER_SITE_OTHER)

## 2023-09-09 DIAGNOSIS — T782XXD Anaphylactic shock, unspecified, subsequent encounter: Secondary | ICD-10-CM | POA: Diagnosis not present

## 2023-09-09 DIAGNOSIS — T63481D Toxic effect of venom of other arthropod, accidental (unintentional), subsequent encounter: Secondary | ICD-10-CM

## 2023-11-04 ENCOUNTER — Ambulatory Visit (INDEPENDENT_AMBULATORY_CARE_PROVIDER_SITE_OTHER)

## 2023-11-04 DIAGNOSIS — T782XXD Anaphylactic shock, unspecified, subsequent encounter: Secondary | ICD-10-CM

## 2023-11-04 DIAGNOSIS — T63481D Toxic effect of venom of other arthropod, accidental (unintentional), subsequent encounter: Secondary | ICD-10-CM

## 2023-12-30 ENCOUNTER — Ambulatory Visit (INDEPENDENT_AMBULATORY_CARE_PROVIDER_SITE_OTHER): Admitting: *Deleted

## 2023-12-30 DIAGNOSIS — T782XXD Anaphylactic shock, unspecified, subsequent encounter: Secondary | ICD-10-CM | POA: Diagnosis not present

## 2023-12-30 DIAGNOSIS — T63481D Toxic effect of venom of other arthropod, accidental (unintentional), subsequent encounter: Secondary | ICD-10-CM

## 2024-02-24 ENCOUNTER — Ambulatory Visit (INDEPENDENT_AMBULATORY_CARE_PROVIDER_SITE_OTHER): Admitting: *Deleted

## 2024-02-24 DIAGNOSIS — T63481D Toxic effect of venom of other arthropod, accidental (unintentional), subsequent encounter: Secondary | ICD-10-CM | POA: Diagnosis not present

## 2024-02-24 DIAGNOSIS — T782XXD Anaphylactic shock, unspecified, subsequent encounter: Secondary | ICD-10-CM | POA: Diagnosis not present

## 2024-02-26 ENCOUNTER — Encounter: Payer: Self-pay | Admitting: Urology

## 2024-02-26 ENCOUNTER — Ambulatory Visit: Admitting: Urology

## 2024-02-26 VITALS — BP 143/87 | HR 94 | Ht 74.0 in | Wt 232.0 lb

## 2024-02-26 DIAGNOSIS — R351 Nocturia: Secondary | ICD-10-CM | POA: Diagnosis not present

## 2024-02-26 DIAGNOSIS — C61 Malignant neoplasm of prostate: Secondary | ICD-10-CM

## 2024-02-26 LAB — URINALYSIS, ROUTINE W REFLEX MICROSCOPIC
Bilirubin, UA: NEGATIVE
Glucose, UA: NEGATIVE
Leukocytes,UA: NEGATIVE
Nitrite, UA: NEGATIVE
Protein,UA: NEGATIVE
RBC, UA: NEGATIVE
Specific Gravity, UA: 1.015 (ref 1.005–1.030)
Urobilinogen, Ur: 1 mg/dL (ref 0.2–1.0)
pH, UA: 7 (ref 5.0–7.5)

## 2024-02-26 LAB — MICROSCOPIC EXAMINATION

## 2024-02-26 NOTE — Progress Notes (Signed)
 Assessment: 1. Prostate cancer (HCC); high risk disease; s/p XRT + ADT   2. Nocturia     Plan: Continue tamsulosin Recommend continued follow-up with PSAs every 6 months. Return to office in 6 months with pre-clinic PSA which will be done by his PCP.  Chief Complaint:  Chief Complaint  Patient presents with   Prostate Cancer    History of Present Illness:  Ruben Rice. is a 78 y.o. male who is seen for evaluation of prostate cancer and nocturia. He has been followed by Dr. Alfonzo with [facility] urology in [place].  His last visit was in October 2024.  He has a history of elevated PSA and had previously had 4 negative biopsies performed in [place].  In 2021, his PSA increased to 79.  Prostate MRI showed a PI-RADS 5 lesion. Prostate biopsy from 8/21 showed Gleason 4+4 = 8 in 4 cores and Gleason 4+5 = 9 in 1 core. He was treated with external beam radiation and ADT.  His last injection was on 08/07/2022. PSA results: 3/22 <0.1 9/22 <0.1 3/23 <0.1 9/23 <0.1 3/24 <0.1 9/24 <0.01 3/25 <0.01 9/25   0.01  He has had bothersome nocturia.  He does use a CPAP for sleep apnea and has some peripheral edema. He was started on hydrochlorothiazide 12.5 mg daily.  He discontinued continued this due to leg cramps.  He was seen in April 2025 to establish care in our office.  His urinary symptoms remained stable.  He continued to take tamsulosin nightly.  He reported nocturia 2-4 times depending on his fluid intake later in the day.  No daytime symptoms.  No dysuria or gross hematuria. IPSS = 7/2. He does have erectile dysfunction but was not interested in any treatment at this time. He was not having any residual side effects from his prior ADT.  He returns today for follow-up.  He continues to have nocturia x 3.  No other significant urinary symptoms.  No dysuria or gross hematuria.  He continues on tamsulosin nightly. IPSS = 3/2.  Portions of the above documentation were copied  from a prior visit for review purposes only.   Past Medical History:  Past Medical History:  Diagnosis Date   Angio-edema    Hymenoptera allergy    OSA (obstructive sleep apnea)    Prostate cancer [facility])     Past Surgical History:  Past Surgical History:  Procedure Laterality Date   ADENOIDECTOMY     TONSILLECTOMY      Allergies:  Allergies  Allergen Reactions   Aspirin Anaphylaxis   Insect Extract Anaphylaxis   Lisinopril Swelling    Other reaction(s): Lip swelling   Mixed Vespid Venom Anaphylaxis   Shellfish Allergy Anaphylaxis   Wasp Venom Anaphylaxis    Family History:  No family history on file.  Social History:  Social History   Tobacco Use   Smoking status: Never   Smokeless tobacco: Never  Vaping Use   Vaping status: Never Used  Substance Use Topics   Alcohol use: Not Currently   Drug use: Never    ROS: Constitutional:  Negative for fever, chills, weight loss CV: Negative for chest pain, previous MI, hypertension Respiratory:  Negative for shortness of breath, wheezing, sleep apnea, frequent cough GI:  Negative for nausea, vomiting, bloody stool, GERD  Physical exam: BP (!) 143/87   Pulse 94   Ht 6' 2 (1.88 m)   Wt 232 lb (105.2 kg)   BMI 29.79 kg/m  GENERAL APPEARANCE:  Well appearing, well developed, well nourished, NAD HEENT:  Atraumatic, normocephalic, oropharynx clear NECK:  Supple without lymphadenopathy or thyromegaly ABDOMEN:  Soft, non-tender, no masses EXTREMITIES:  Moves all extremities well, without clubbing, cyanosis, or edema NEUROLOGIC:  Alert and oriented x 3, normal gait, CN II-XII grossly intact MENTAL STATUS:  appropriate BACK:  Non-tender to palpation, No CVAT SKIN:  Warm, dry, and intact  Results: U/A: 0-5 WBCs, 0-2 RBCs

## 2024-04-20 ENCOUNTER — Ambulatory Visit: Admitting: *Deleted

## 2024-04-20 DIAGNOSIS — T782XXD Anaphylactic shock, unspecified, subsequent encounter: Secondary | ICD-10-CM | POA: Diagnosis not present

## 2024-04-20 DIAGNOSIS — T63481D Toxic effect of venom of other arthropod, accidental (unintentional), subsequent encounter: Secondary | ICD-10-CM | POA: Diagnosis not present

## 2024-06-15 ENCOUNTER — Ambulatory Visit

## 2024-06-15 DIAGNOSIS — T782XXD Anaphylactic shock, unspecified, subsequent encounter: Secondary | ICD-10-CM

## 2024-06-15 DIAGNOSIS — T63481D Toxic effect of venom of other arthropod, accidental (unintentional), subsequent encounter: Secondary | ICD-10-CM | POA: Diagnosis not present

## 2024-08-10 ENCOUNTER — Ambulatory Visit

## 2024-08-26 ENCOUNTER — Ambulatory Visit: Admitting: Urology
